# Patient Record
Sex: Female | Born: 1955 | Race: Asian | Hispanic: No | Marital: Married | Smoking: Current some day smoker
Health system: Southern US, Community
[De-identification: ages and names within clinical notes are randomized; demographics above are authoritative.]

## PROBLEM LIST (undated history)

## (undated) DIAGNOSIS — D649 Anemia, unspecified: Secondary | ICD-10-CM

## (undated) DIAGNOSIS — IMO0001 Reserved for inherently not codable concepts without codable children: Secondary | ICD-10-CM

## (undated) DIAGNOSIS — E079 Disorder of thyroid, unspecified: Secondary | ICD-10-CM

## (undated) DIAGNOSIS — M1990 Unspecified osteoarthritis, unspecified site: Secondary | ICD-10-CM

## (undated) DIAGNOSIS — I10 Essential (primary) hypertension: Secondary | ICD-10-CM

## (undated) DIAGNOSIS — R3915 Urgency of urination: Secondary | ICD-10-CM

## (undated) HISTORY — PX: FRACTURE SURGERY: SHX138

## (undated) HISTORY — PX: EXTERNAL EAR SURGERY: SHX627

## (undated) HISTORY — PX: ABDOMINAL HYSTERECTOMY: SHX81

## (undated) HISTORY — PX: COLONOSCOPY: SHX174

---

## 2007-04-23 ENCOUNTER — Encounter: Admission: RE | Admit: 2007-04-23 | Discharge: 2007-04-23 | Payer: Self-pay | Admitting: Otolaryngology

## 2008-07-26 ENCOUNTER — Other Ambulatory Visit: Admission: RE | Admit: 2008-07-26 | Discharge: 2008-07-26 | Payer: Self-pay | Admitting: Family Medicine

## 2010-07-08 ENCOUNTER — Encounter: Payer: Self-pay | Admitting: Otolaryngology

## 2011-08-28 ENCOUNTER — Other Ambulatory Visit: Payer: Self-pay | Admitting: Family Medicine

## 2011-08-28 DIAGNOSIS — Z1231 Encounter for screening mammogram for malignant neoplasm of breast: Secondary | ICD-10-CM

## 2011-09-30 ENCOUNTER — Ambulatory Visit
Admission: RE | Admit: 2011-09-30 | Discharge: 2011-09-30 | Disposition: A | Discharge status: Home / Self Care | Payer: Federal, State, Local not specified - PPO | Source: Ambulatory Visit | Attending: Family Medicine | Admitting: Family Medicine

## 2011-09-30 DIAGNOSIS — Z1231 Encounter for screening mammogram for malignant neoplasm of breast: Secondary | ICD-10-CM

## 2011-10-02 ENCOUNTER — Other Ambulatory Visit: Payer: Self-pay | Admitting: Otolaryngology

## 2011-10-02 DIAGNOSIS — H7190 Unspecified cholesteatoma, unspecified ear: Secondary | ICD-10-CM

## 2011-10-02 DIAGNOSIS — H903 Sensorineural hearing loss, bilateral: Secondary | ICD-10-CM

## 2011-10-08 ENCOUNTER — Ambulatory Visit
Admission: RE | Admit: 2011-10-08 | Discharge: 2011-10-08 | Disposition: A | Discharge status: Home / Self Care | Payer: Federal, State, Local not specified - PPO | Source: Ambulatory Visit | Attending: Otolaryngology | Admitting: Otolaryngology

## 2011-10-08 DIAGNOSIS — H903 Sensorineural hearing loss, bilateral: Secondary | ICD-10-CM

## 2011-10-08 DIAGNOSIS — H7190 Unspecified cholesteatoma, unspecified ear: Secondary | ICD-10-CM

## 2011-10-29 ENCOUNTER — Other Ambulatory Visit: Payer: Self-pay | Admitting: Otolaryngology

## 2013-12-21 ENCOUNTER — Other Ambulatory Visit: Payer: Self-pay

## 2013-12-21 DIAGNOSIS — Z1231 Encounter for screening mammogram for malignant neoplasm of breast: Secondary | ICD-10-CM

## 2013-12-24 ENCOUNTER — Ambulatory Visit
Admission: RE | Admit: 2013-12-24 | Discharge: 2013-12-24 | Disposition: A | Discharge status: Home / Self Care | Payer: Federal, State, Local not specified - PPO | Source: Ambulatory Visit

## 2013-12-24 ENCOUNTER — Encounter (INDEPENDENT_AMBULATORY_CARE_PROVIDER_SITE_OTHER): Payer: Self-pay

## 2013-12-24 DIAGNOSIS — Z1231 Encounter for screening mammogram for malignant neoplasm of breast: Secondary | ICD-10-CM

## 2014-04-08 ENCOUNTER — Ambulatory Visit
Admission: RE | Admit: 2014-04-08 | Discharge: 2014-04-08 | Disposition: A | Discharge status: Home / Self Care | Payer: Federal, State, Local not specified - PPO | Source: Ambulatory Visit | Attending: Physician Assistant | Admitting: Physician Assistant

## 2014-04-08 ENCOUNTER — Other Ambulatory Visit: Payer: Self-pay | Admitting: Physician Assistant

## 2014-04-08 DIAGNOSIS — M79671 Pain in right foot: Secondary | ICD-10-CM

## 2014-04-08 DIAGNOSIS — M79672 Pain in left foot: Principal | ICD-10-CM

## 2016-02-18 ENCOUNTER — Emergency Department (HOSPITAL_COMMUNITY)
Admission: EM | Admit: 2016-02-18 | Discharge: 2016-02-18 | Disposition: A | Discharge status: Home / Self Care | Payer: Federal, State, Local not specified - PPO | Attending: Emergency Medicine | Admitting: Emergency Medicine

## 2016-02-18 ENCOUNTER — Encounter (HOSPITAL_COMMUNITY): Payer: Self-pay | Admitting: Emergency Medicine

## 2016-02-18 ENCOUNTER — Emergency Department (HOSPITAL_COMMUNITY): Payer: Federal, State, Local not specified - PPO

## 2016-02-18 DIAGNOSIS — S52611A Displaced fracture of right ulna styloid process, initial encounter for closed fracture: Secondary | ICD-10-CM | POA: Diagnosis not present

## 2016-02-18 DIAGNOSIS — S52501A Unspecified fracture of the lower end of right radius, initial encounter for closed fracture: Secondary | ICD-10-CM | POA: Insufficient documentation

## 2016-02-18 DIAGNOSIS — Z87891 Personal history of nicotine dependence: Secondary | ICD-10-CM | POA: Insufficient documentation

## 2016-02-18 DIAGNOSIS — Y939 Activity, unspecified: Secondary | ICD-10-CM | POA: Insufficient documentation

## 2016-02-18 DIAGNOSIS — W010XXA Fall on same level from slipping, tripping and stumbling without subsequent striking against object, initial encounter: Secondary | ICD-10-CM | POA: Insufficient documentation

## 2016-02-18 DIAGNOSIS — S62101A Fracture of unspecified carpal bone, right wrist, initial encounter for closed fracture: Secondary | ICD-10-CM

## 2016-02-18 DIAGNOSIS — Y929 Unspecified place or not applicable: Secondary | ICD-10-CM | POA: Diagnosis not present

## 2016-02-18 DIAGNOSIS — Y999 Unspecified external cause status: Secondary | ICD-10-CM | POA: Insufficient documentation

## 2016-02-18 DIAGNOSIS — I10 Essential (primary) hypertension: Secondary | ICD-10-CM | POA: Insufficient documentation

## 2016-02-18 DIAGNOSIS — S6991XA Unspecified injury of right wrist, hand and finger(s), initial encounter: Secondary | ICD-10-CM | POA: Diagnosis present

## 2016-02-18 HISTORY — DX: Disorder of thyroid, unspecified: E07.9

## 2016-02-18 HISTORY — DX: Essential (primary) hypertension: I10

## 2016-02-18 MED ORDER — OXYCODONE-ACETAMINOPHEN 5-325 MG PO TABS
1.0000 | ORAL_TABLET | ORAL | Status: DC | PRN
  Administered 2016-02-18: 1 via ORAL
  Filled 2016-02-18: qty 1

## 2016-02-18 MED ORDER — OXYCODONE-ACETAMINOPHEN 5-325 MG PO TABS: 1.0000 | ORAL_TABLET | Freq: Four times a day (QID) | ORAL | 0 refills | Status: AC | PRN

## 2016-02-18 NOTE — ED Triage Notes (Signed)
Pt to ED c/o wrist pain. Pt fell this morning on her wrist. Pt states she can't move wrist anymore. Pt said it started swelling right after fall. Pt applied ice to wrist after fall. Pt can still feel and move fingers. Pt has strong pulse in wrist.

## 2016-02-18 NOTE — ED Provider Notes (Signed)
[facility]-EMERGENCY DEPT Provider Note   CSN: [id number] Arrival date & time: 02/18/16  7829     History   Chief Complaint Chief Complaint  Patient presents with  . Wrist Pain    HPI Kimberly Gates is a 60 y.o. female.  HPI Patient presents to the emergency department with right wrist pain following a fall that occurs prior to arrival.  Patient states she slipped on some hardwood floors and landed on an outstretched hand.  She is complaining of right wrist pain.  She states that movement and palpation make the pain worse.  She states she did not take any medications prior to arrival for her symptoms.  Patient states that she has not have any other injury.  Patient denies headache, blurred vision, numbness, weakness, or syncope Past Medical History:  Diagnosis Date  . Hypertension   . Thyroid disease     There are no active problems to display for this patient.   Past Surgical History:  Procedure Laterality Date  . CESAREAN SECTION     x3    OB History    No data available       Home Medications    Prior to Admission medications   Not on File    Family History No family history on file.  Social History Social History  Substance Use Topics  . Smoking status: Former Smoker    Types: Cigarettes    Quit date: 2012  . Smokeless tobacco: Never Used  . Alcohol use No     Allergies   Review of patient's allergies indicates not on file.   Review of Systems Review of Systems All other systems negative except as documented in the HPI. All pertinent positives and negatives as reviewed in the HPI.  Physical Exam Updated Vital Signs BP (!) 156/103 (BP Location: Left Arm)   Pulse 76   Temp 98.5 F (36.9 C) (Oral)   Resp 18   Ht 4\' 9"  (1.448 m)   Wt 69.4 kg   SpO2 100%   BMI 33.11 kg/m   Physical Exam  Constitutional: She is oriented to person, place, and time. She appears well-developed and well-nourished.  Eyes: Pupils are equal, round, and  reactive to light.  Pulmonary/Chest: Effort normal.  Musculoskeletal:       Right wrist: She exhibits decreased range of motion, tenderness, bony tenderness and swelling. She exhibits no crepitus, no deformity and no laceration.  Neurological: She is alert and oriented to person, place, and time.  Skin: Skin is warm and dry.  Psychiatric: She has a normal mood and affect.     ED Treatments / Results  Labs (all labs ordered are listed, but only abnormal results are displayed) Labs Reviewed - No data to display  EKG  EKG Interpretation None       Radiology Dg Wrist Complete Right  Result Date: 02/18/2016 CLINICAL DATA:  Status post fall with right wrist pain EXAM: RIGHT WRIST - COMPLETE 3+ VIEW COMPARISON:  None. FINDINGS: There is comminuted displaced impacted intra-articular fracture of distal radius. There is fracture of ulna styloid. IMPRESSION: Fracture of distal ulna and radius. Electronically Signed   By: Sherian Rein M.D.   On: 02/18/2016 09:55    Procedures Procedures (including critical care time)  Medications Ordered in ED Medications  oxyCODONE-acetaminophen (PERCOCET/ROXICET) 5-325 MG per tablet 1 tablet (1 tablet Oral Given 02/18/16 0953)     Initial Impression / Assessment and Plan / ED Course  I have reviewed the triage  vital signs and the nursing notes.  Pertinent labs & imaging results that were available during my care of the patient were reviewed by me and considered in my medical decision making (see chart for details).  Clinical Course    Patient has a distal radius fracture with ulnar styloid fracture.  All splint the patient and have her follow-up with hand surgery.  Patient is advised plan and all questions were answered  Final Clinical Impressions(s) / ED Diagnoses   Final diagnoses:  None    New Prescriptions New Prescriptions   No medications on file     Charlestine NightChristopher Raymund Manrique, PA-C 02/19/16 1539    Lorre NickAnthony Allen, MD 02/20/16 [phone]

## 2016-02-18 NOTE — Discharge Instructions (Signed)
Follow-up with the hand surgeon provided.  Return here as needed.  Ice and elevate the wrist. °

## 2016-02-20 ENCOUNTER — Encounter (HOSPITAL_COMMUNITY): Payer: Self-pay | Admitting: *Deleted

## 2016-02-20 NOTE — H&P (Signed)
Kimberly Gates is an 60 y.o. female.   Chief Complaint: RIGHT WRIST INJURY HPI: PT SUSTAINED FALL AND LANDED ON RIGHT WRIST PT SEEN/EVALAUTED IN OFFICE PT HERE FOR SURGERY ON RIGHT WRIST FOR COMMINUTED RIGHT DISTAL RADIUS FRACTURE NO PRIOR SURGERY TO RIGHT WRIST  Past Medical History:  Diagnosis Date  . Anemia    "long time ago"  . Arthritis   . Hypertension   . Shortness of breath dyspnea    on long walks  . Thyroid disease   . Urinary urgency     Past Surgical History:  Procedure Laterality Date  . ABDOMINAL HYSTERECTOMY    . CESAREAN SECTION     x3  . COLONOSCOPY    . EXTERNAL EAR SURGERY      Family History  Problem Relation Age of Onset  . Heart disease Mother   . Hypertension Mother   . Heart disease Father   . Hypertension Father    Social History:  reports that she quit smoking about 5 years ago. Her smoking use included Cigarettes. She has never used smokeless tobacco. She reports that she drinks alcohol. She reports that she does not use drugs.  Allergies: Not on File  No prescriptions prior to admission.    No results found for this or any previous visit (from the past 48 hour(s)). No results found.  ROSNO RECENT ILLNESSES OR HOSPITALIZATIONS  There were no vitals taken for this visit. Physical Exam   General Appearance:  Alert, cooperative, no distress, appears stated age  Head:  Normocephalic, without obvious abnormality, atraumatic  Eyes:  Pupils equal, conjunctiva/corneas clear,         Throat: Lips, mucosa, and tongue normal; teeth and gums normal  Neck: No visible masses     Lungs:   respirations unlabored  Chest Wall:  No tenderness or deformity  Heart:  Regular rate and rhythm,  Abdomen:   Soft, non-tender,         Extremities: RUE: SKIN INTACT, FINGERS WARM WELL PERFUSED ABLE TO EXTEND THUMB AND FINGERS GOOD DIGITAL FLEXION  Pulses: 2+ and symmetric  Skin: Skin color, texture, turgor normal, no rashes or lesions      Neurologic: Normal    Assessment/Plan RIGHT COMMINUTED DISTAL RADIUS FRACTURE, DISPLACED  RIGHT WRIST OPEN REDUCTION AND INTERNAL FIXATION AND REPAIR AS INDICATED  R/B/A DISCUSSED WITH PT IN OFFICE.  PT VOICED UNDERSTANDING OF PLAN CONSENT SIGNED DAY OF SURGERY PT SEEN AND EXAMINED PRIOR TO OPERATIVE PROCEDURE/DAY OF SURGERY SITE MARKED. QUESTIONS ANSWERED WILL BE ADMITTED OBSERVATION FOLLOWING SURGERY  WE ARE PLANNING SURGERY FOR YOUR UPPER EXTREMITY. THE RISKS AND BENEFITS OF SURGERY INCLUDE BUT NOT LIMITED TO BLEEDING INFECTION, DAMAGE TO NEARBY NERVES ARTERIES TENDONS, FAILURE OF SURGERY TO ACCOMPLISH ITS INTENDED GOALS, PERSISTENT SYMPTOMS AND NEED FOR FURTHER SURGICAL INTERVENTION. WITH THIS IN MIND WE WILL PROCEED. I HAVE DISCUSSED WITH THE PATIENT THE PRE AND POSTOPERATIVE REGIMEN AND THE DOS AND DON'TS. PT VOICED UNDERSTANDING AND INFORMED CONSENT SIGNED. Sharma CovertORTMANN,Kimberly Gates W  02/21/2016 AT 1840

## 2016-02-20 NOTE — Progress Notes (Signed)
Spoke with pt for pre-op call. Pt denies cardiac history and chest pain. States she does get sob with exertion (hiking).

## 2016-02-21 ENCOUNTER — Encounter (HOSPITAL_COMMUNITY)
Admission: RE | Disposition: A | Discharge status: Home / Self Care | Payer: Self-pay | Source: Ambulatory Visit | Attending: Orthopedic Surgery

## 2016-02-21 ENCOUNTER — Ambulatory Visit (HOSPITAL_COMMUNITY): Payer: Federal, State, Local not specified - PPO | Admitting: Anesthesiology

## 2016-02-21 ENCOUNTER — Encounter (HOSPITAL_COMMUNITY): Payer: Self-pay | Admitting: *Deleted

## 2016-02-21 ENCOUNTER — Ambulatory Visit (HOSPITAL_COMMUNITY)
Admission: RE | Admit: 2016-02-21 | Discharge: 2016-02-22 | Disposition: A | Discharge status: Home / Self Care | Payer: Federal, State, Local not specified - PPO | Source: Ambulatory Visit | Attending: Orthopedic Surgery | Admitting: Orthopedic Surgery

## 2016-02-21 DIAGNOSIS — S52571A Other intraarticular fracture of lower end of right radius, initial encounter for closed fracture: Secondary | ICD-10-CM | POA: Diagnosis present

## 2016-02-21 DIAGNOSIS — I10 Essential (primary) hypertension: Secondary | ICD-10-CM | POA: Diagnosis not present

## 2016-02-21 DIAGNOSIS — E039 Hypothyroidism, unspecified: Secondary | ICD-10-CM | POA: Diagnosis not present

## 2016-02-21 DIAGNOSIS — Z87891 Personal history of nicotine dependence: Secondary | ICD-10-CM | POA: Diagnosis not present

## 2016-02-21 DIAGNOSIS — Z79899 Other long term (current) drug therapy: Secondary | ICD-10-CM | POA: Diagnosis not present

## 2016-02-21 DIAGNOSIS — W19XXXA Unspecified fall, initial encounter: Secondary | ICD-10-CM | POA: Diagnosis not present

## 2016-02-21 DIAGNOSIS — S52611A Displaced fracture of right ulna styloid process, initial encounter for closed fracture: Secondary | ICD-10-CM | POA: Diagnosis not present

## 2016-02-21 DIAGNOSIS — S52531A Colles' fracture of right radius, initial encounter for closed fracture: Secondary | ICD-10-CM | POA: Diagnosis present

## 2016-02-21 HISTORY — DX: Reserved for inherently not codable concepts without codable children: IMO0001

## 2016-02-21 HISTORY — DX: Unspecified osteoarthritis, unspecified site: M19.90

## 2016-02-21 HISTORY — DX: Anemia, unspecified: D64.9

## 2016-02-21 HISTORY — PX: OPEN REDUCTION INTERNAL FIXATION (ORIF) DISTAL RADIAL FRACTURE: SHX5989

## 2016-02-21 HISTORY — DX: Urgency of urination: R39.15

## 2016-02-21 LAB — BASIC METABOLIC PANEL
ANION GAP: 12 (ref 5–15)
BUN: 17 mg/dL (ref 6–20)
CO2: 22 mmol/L (ref 22–32)
Calcium: 10 mg/dL (ref 8.9–10.3)
Chloride: 105 mmol/L (ref 101–111)
Creatinine, Ser: 0.7 mg/dL (ref 0.44–1.00)
GFR calc Af Amer: >60 mL/min (ref >60)
GFR calc non Af Amer: >60 mL/min (ref >60)
GLUCOSE: 90 mg/dL (ref 65–99)
POTASSIUM: 4 mmol/L (ref 3.5–5.1)
Sodium: 139 mmol/L (ref 135–145)

## 2016-02-21 LAB — CBC
HEMATOCRIT: 41.6 % (ref 36.0–46.0)
HEMOGLOBIN: 13.5 g/dL (ref 12.0–15.0)
MCH: 28.8 pg (ref 26.0–34.0)
MCHC: 32.5 g/dL (ref 30.0–36.0)
MCV: 88.9 fL (ref 78.0–100.0)
Platelets: 266 10*3/uL (ref 150–400)
RBC: 4.68 MIL/uL (ref 3.87–5.11)
RDW: 13.2 % (ref 11.5–15.5)
WBC: 9.4 10*3/uL (ref 4.0–10.5)

## 2016-02-21 LAB — SURGICAL PCR SCREEN
MRSA, PCR: NEGATIVE
Staphylococcus aureus: NEGATIVE

## 2016-02-21 SURGERY — OPEN REDUCTION INTERNAL FIXATION (ORIF) DISTAL RADIUS FRACTURE
Anesthesia: General | Site: Wrist | Laterality: Right

## 2016-02-21 MED ORDER — ADULT MULTIVITAMIN W/MINERALS CH
ORAL_TABLET | Freq: Every day | ORAL | Status: DC
  Filled 2016-02-21: qty 1

## 2016-02-21 MED ORDER — VITAMIN C 500 MG PO TABS
1000.0000 mg | ORAL_TABLET | Freq: Every day | ORAL | Status: DC
  Administered 2016-02-21 – 2016-02-22 (×2): 1000 mg via ORAL
  Filled 2016-02-21 (×2): qty 2

## 2016-02-21 MED ORDER — DIPHENHYDRAMINE HCL 25 MG PO CAPS: 25.0000 mg | ORAL_CAPSULE | Freq: Four times a day (QID) | ORAL | Status: DC | PRN

## 2016-02-21 MED ORDER — LACTATED RINGERS IV SOLN
INTRAVENOUS | Status: DC | PRN
  Administered 2016-02-21: 19:00:00 via INTRAVENOUS

## 2016-02-21 MED ORDER — KCL IN DEXTROSE-NACL 20-5-0.45 MEQ/L-%-% IV SOLN
INTRAVENOUS | Status: DC
  Administered 2016-02-21: 21:00:00 via INTRAVENOUS
  Filled 2016-02-21: qty 1000

## 2016-02-21 MED ORDER — LEVOTHYROXINE SODIUM 75 MCG PO TABS
75.0000 ug | ORAL_TABLET | Freq: Every day | ORAL | Status: DC
  Administered 2016-02-22: 75 ug via ORAL
  Filled 2016-02-21: qty 1

## 2016-02-21 MED ORDER — CEFAZOLIN SODIUM-DEXTROSE 2-4 GM/100ML-% IV SOLN
INTRAVENOUS | Status: AC
  Filled 2016-02-21: qty 100

## 2016-02-21 MED ORDER — ONDANSETRON HCL 4 MG PO TABS: 4.0000 mg | ORAL_TABLET | Freq: Four times a day (QID) | ORAL | Status: DC | PRN

## 2016-02-21 MED ORDER — HYDROMORPHONE HCL 1 MG/ML IJ SOLN: 0.5000 mg | INTRAMUSCULAR | Status: DC | PRN

## 2016-02-21 MED ORDER — FENTANYL CITRATE (PF) 100 MCG/2ML IJ SOLN
INTRAMUSCULAR | Status: AC
  Filled 2016-02-21: qty 2

## 2016-02-21 MED ORDER — 0.9 % SODIUM CHLORIDE (POUR BTL) OPTIME
TOPICAL | Status: DC | PRN
  Administered 2016-02-21: 1000 mL

## 2016-02-21 MED ORDER — HYDROCODONE-ACETAMINOPHEN 7.5-325 MG PO TABS
1.0000 | ORAL_TABLET | ORAL | Status: DC | PRN
  Administered 2016-02-22: 1 via ORAL
  Filled 2016-02-21: qty 1

## 2016-02-21 MED ORDER — ADULT MULTIVITAMIN W/MINERALS CH
1.0000 | ORAL_TABLET | Freq: Every day | ORAL | Status: DC
  Administered 2016-02-21 – 2016-02-22 (×2): 1 via ORAL
  Filled 2016-02-21 (×2): qty 1

## 2016-02-21 MED ORDER — PROPOFOL 10 MG/ML IV BOLUS
INTRAVENOUS | Status: DC | PRN
  Administered 2016-02-21: 50 mg via INTRAVENOUS
  Administered 2016-02-21: 150 mg via INTRAVENOUS

## 2016-02-21 MED ORDER — BUPIVACAINE-EPINEPHRINE (PF) 0.5% -1:200000 IJ SOLN
INTRAMUSCULAR | Status: DC | PRN
  Administered 2016-02-21: 30 mL via PERINEURAL

## 2016-02-21 MED ORDER — SUCCINYLCHOLINE CHLORIDE 20 MG/ML IJ SOLN
INTRAMUSCULAR | Status: DC | PRN
  Administered 2016-02-21: 20 mg via INTRAVENOUS

## 2016-02-21 MED ORDER — MIDAZOLAM HCL 2 MG/2ML IJ SOLN
INTRAMUSCULAR | Status: AC
  Administered 2016-02-21: 2 mg via INTRAVENOUS
  Filled 2016-02-21: qty 2

## 2016-02-21 MED ORDER — LACTATED RINGERS IV SOLN: INTRAVENOUS | Status: DC

## 2016-02-21 MED ORDER — CHLORHEXIDINE GLUCONATE 4 % EX LIQD: 60.0000 mL | Freq: Once | CUTANEOUS | Status: DC

## 2016-02-21 MED ORDER — MIDAZOLAM HCL 2 MG/2ML IJ SOLN
INTRAMUSCULAR | Status: AC
  Filled 2016-02-21: qty 2

## 2016-02-21 MED ORDER — CEFAZOLIN IN D5W 1 GM/50ML IV SOLN
1.0000 g | INTRAVENOUS | Status: AC
  Administered 2016-02-21: 1 g via INTRAVENOUS
  Filled 2016-02-21: qty 50

## 2016-02-21 MED ORDER — MEPERIDINE HCL 25 MG/ML IJ SOLN: 6.2500 mg | INTRAMUSCULAR | Status: DC | PRN

## 2016-02-21 MED ORDER — DOCUSATE SODIUM 100 MG PO CAPS: 100.0000 mg | ORAL_CAPSULE | Freq: Two times a day (BID) | ORAL | 0 refills | Status: AC

## 2016-02-21 MED ORDER — PROPOFOL 10 MG/ML IV BOLUS
INTRAVENOUS | Status: AC
  Filled 2016-02-21: qty 20

## 2016-02-21 MED ORDER — MUPIROCIN 2 % EX OINT
TOPICAL_OINTMENT | CUTANEOUS | Status: AC
  Administered 2016-02-21: 1
  Filled 2016-02-21: qty 22

## 2016-02-21 MED ORDER — FENTANYL CITRATE (PF) 100 MCG/2ML IJ SOLN
INTRAMUSCULAR | Status: DC | PRN
  Administered 2016-02-21 (×2): 50 ug via INTRAVENOUS

## 2016-02-21 MED ORDER — ZOLPIDEM TARTRATE 5 MG PO TABS: 5.0000 mg | ORAL_TABLET | Freq: Every evening | ORAL | Status: DC | PRN

## 2016-02-21 MED ORDER — CEFAZOLIN IN D5W 1 GM/50ML IV SOLN
1.0000 g | Freq: Three times a day (TID) | INTRAVENOUS | Status: DC
  Administered 2016-02-22: 1 g via INTRAVENOUS
  Filled 2016-02-21 (×3): qty 50

## 2016-02-21 MED ORDER — METHOCARBAMOL 1000 MG/10ML IJ SOLN
500.0000 mg | Freq: Four times a day (QID) | INTRAVENOUS | Status: DC | PRN
  Filled 2016-02-21: qty 5

## 2016-02-21 MED ORDER — BENAZEPRIL HCL 10 MG PO TABS
10.0000 mg | ORAL_TABLET | Freq: Every day | ORAL | Status: DC
  Administered 2016-02-22: 10 mg via ORAL
  Filled 2016-02-21 (×2): qty 1

## 2016-02-21 MED ORDER — MIDAZOLAM HCL 5 MG/5ML IJ SOLN
INTRAMUSCULAR | Status: DC | PRN
  Administered 2016-02-21: 2 mg via INTRAVENOUS

## 2016-02-21 MED ORDER — VITAMIN C 500 MG PO TABS: 500.0000 mg | ORAL_TABLET | Freq: Every day | ORAL | 0 refills | Status: AC

## 2016-02-21 MED ORDER — MIDAZOLAM HCL 2 MG/2ML IJ SOLN
2.0000 mg | Freq: Once | INTRAMUSCULAR | Status: AC
  Administered 2016-02-21: 2 mg via INTRAVENOUS

## 2016-02-21 MED ORDER — METHOCARBAMOL 500 MG PO TABS: 500.0000 mg | ORAL_TABLET | Freq: Four times a day (QID) | ORAL | 0 refills | Status: AC

## 2016-02-21 MED ORDER — PROMETHAZINE HCL 25 MG/ML IJ SOLN
6.2500 mg | INTRAMUSCULAR | Status: DC | PRN
Start: 2016-02-21 — End: 2016-02-21

## 2016-02-21 MED ORDER — OXYCODONE-ACETAMINOPHEN 5-325 MG PO TABS: 1.0000 | ORAL_TABLET | ORAL | Status: DC | PRN

## 2016-02-21 MED ORDER — OXYCODONE-ACETAMINOPHEN 5-325 MG PO TABS: 1.0000 | ORAL_TABLET | Freq: Three times a day (TID) | ORAL | 0 refills | Status: AC

## 2016-02-21 MED ORDER — METHOCARBAMOL 500 MG PO TABS: 500.0000 mg | ORAL_TABLET | Freq: Four times a day (QID) | ORAL | Status: DC | PRN

## 2016-02-21 MED ORDER — LIDOCAINE 2% (20 MG/ML) 5 ML SYRINGE
INTRAMUSCULAR | Status: AC
  Filled 2016-02-21: qty 5

## 2016-02-21 MED ORDER — LACTATED RINGERS IV SOLN
INTRAVENOUS | Status: DC
  Administered 2016-02-21: 17:00:00 via INTRAVENOUS

## 2016-02-21 MED ORDER — OXYCODONE-ACETAMINOPHEN 5-325 MG PO TABS: 1.0000 | ORAL_TABLET | Freq: Four times a day (QID) | ORAL | Status: DC | PRN

## 2016-02-21 MED ORDER — LIDOCAINE HCL (CARDIAC) 20 MG/ML IV SOLN
INTRAVENOUS | Status: DC | PRN
  Administered 2016-02-21: 60 mg via INTRAVENOUS

## 2016-02-21 MED ORDER — ONDANSETRON HCL 4 MG/2ML IJ SOLN
4.0000 mg | Freq: Four times a day (QID) | INTRAMUSCULAR | Status: DC | PRN
Start: 2016-02-21 — End: 2016-02-22

## 2016-02-21 MED ORDER — HYDROMORPHONE HCL 1 MG/ML IJ SOLN: 0.2500 mg | INTRAMUSCULAR | Status: DC | PRN

## 2016-02-21 MED ORDER — ONDANSETRON HCL 4 MG/2ML IJ SOLN
INTRAMUSCULAR | Status: DC | PRN
  Administered 2016-02-21: 4 mg via INTRAVENOUS

## 2016-02-21 MED ORDER — CEFAZOLIN SODIUM-DEXTROSE 2-4 GM/100ML-% IV SOLN
2.0000 g | INTRAVENOUS | Status: AC
  Administered 2016-02-21: 2 g via INTRAVENOUS

## 2016-02-21 MED ORDER — FENTANYL CITRATE (PF) 100 MCG/2ML IJ SOLN
INTRAMUSCULAR | Status: AC
  Administered 2016-02-21: 50 ug via INTRAVENOUS
  Filled 2016-02-21: qty 2

## 2016-02-21 MED ORDER — FENTANYL CITRATE (PF) 100 MCG/2ML IJ SOLN
50.0000 ug | Freq: Once | INTRAMUSCULAR | Status: AC
  Administered 2016-02-21: 50 ug via INTRAVENOUS

## 2016-02-21 SURGICAL SUPPLY — 54 items
BANDAGE ELASTIC 3 VELCRO ST LF (GAUZE/BANDAGES/DRESSINGS) ×2 IMPLANT
BANDAGE ELASTIC 4 VELCRO ST LF (GAUZE/BANDAGES/DRESSINGS) ×2 IMPLANT
BIT DRILL 2.2 SS TIBIAL (BIT) ×2 IMPLANT
BLADE SURG ROTATE 9660 (MISCELLANEOUS) IMPLANT
BNDG ESMARK 4X9 LF (GAUZE/BANDAGES/DRESSINGS) ×2 IMPLANT
BNDG GAUZE ELAST 4 BULKY (GAUZE/BANDAGES/DRESSINGS) ×2 IMPLANT
CANISTER SUCTION 2500CC (MISCELLANEOUS) ×2 IMPLANT
CORDS BIPOLAR (ELECTRODE) ×2 IMPLANT
COVER SURGICAL LIGHT HANDLE (MISCELLANEOUS) ×2 IMPLANT
CUFF TOURNIQUET SINGLE 18IN (TOURNIQUET CUFF) ×2 IMPLANT
CUFF TOURNIQUET SINGLE 24IN (TOURNIQUET CUFF) IMPLANT
DECANTER SPIKE VIAL GLASS SM (MISCELLANEOUS) ×2 IMPLANT
DRAPE OEC MINIVIEW 54X84 (DRAPES) ×2 IMPLANT
DRAPE SURG 17X11 SM STRL (DRAPES) ×2 IMPLANT
DRSG ADAPTIC 3X8 NADH LF (GAUZE/BANDAGES/DRESSINGS) ×2 IMPLANT
GAUZE SPONGE 4X4 12PLY STRL (GAUZE/BANDAGES/DRESSINGS) ×2 IMPLANT
GAUZE SPONGE 4X4 16PLY XRAY LF (GAUZE/BANDAGES/DRESSINGS) IMPLANT
GLOVE BIOGEL PI IND STRL 8.5 (GLOVE) ×1 IMPLANT
GLOVE BIOGEL PI INDICATOR 8.5 (GLOVE) ×1
GLOVE SURG ORTHO 8.0 STRL STRW (GLOVE) ×2 IMPLANT
GOWN STRL REUS W/ TWL LRG LVL3 (GOWN DISPOSABLE) ×1 IMPLANT
GOWN STRL REUS W/ TWL XL LVL3 (GOWN DISPOSABLE) ×1 IMPLANT
GOWN STRL REUS W/TWL LRG LVL3 (GOWN DISPOSABLE) ×1
GOWN STRL REUS W/TWL XL LVL3 (GOWN DISPOSABLE) ×1
K-WIRE 1.6 (WIRE) ×1
K-WIRE FX5X1.6XNS BN SS (WIRE) ×1
KIT BASIN OR (CUSTOM PROCEDURE TRAY) ×2 IMPLANT
KIT ROOM TURNOVER OR (KITS) ×2 IMPLANT
KWIRE FX5X1.6XNS BN SS (WIRE) ×1 IMPLANT
NEEDLE HYPO 25X1 1.5 SAFETY (NEEDLE) ×2 IMPLANT
NS IRRIG 1000ML POUR BTL (IV SOLUTION) ×2 IMPLANT
PACK ORTHO EXTREMITY (CUSTOM PROCEDURE TRAY) ×2 IMPLANT
PAD ARMBOARD 7.5X6 YLW CONV (MISCELLANEOUS) ×4 IMPLANT
PAD CAST 4YDX4 CTTN HI CHSV (CAST SUPPLIES) ×1 IMPLANT
PADDING CAST COTTON 4X4 STRL (CAST SUPPLIES) ×1
PEG LOCKING SMOOTH 2.2X16 (Screw) ×2 IMPLANT
PEG LOCKING SMOOTH 2.2X18 (Peg) ×4 IMPLANT
PEG LOCKING SMOOTH 2.2X20 (Screw) ×6 IMPLANT
PLATE DVR CROSSLOCK STD RT (Plate) ×2 IMPLANT
SCREW LOCK 12X2.7X 3 LD (Screw) ×2 IMPLANT
SCREW LOCK 14X2.7X 3 LD TPR (Screw) ×3 IMPLANT
SCREW LOCKING 2.7X12MM (Screw) ×2 IMPLANT
SCREW LOCKING 2.7X14 (Screw) ×3 IMPLANT
SCREW MULTI DIRECTIONAL 2.7X16 (Screw) ×2 IMPLANT
SOAP 2 % CHG 4 OZ (WOUND CARE) ×2 IMPLANT
SPONGE LAP 4X18 X RAY DECT (DISPOSABLE) ×2 IMPLANT
SUT PROLENE 4 0 PS 2 18 (SUTURE) ×4 IMPLANT
SUT VIC AB 2-0 FS1 27 (SUTURE) ×2 IMPLANT
SUT VICRYL 4-0 PS2 18IN ABS (SUTURE) ×2 IMPLANT
SYR CONTROL 10ML LL (SYRINGE) IMPLANT
TOWEL OR 17X24 6PK STRL BLUE (TOWEL DISPOSABLE) ×2 IMPLANT
TOWEL OR 17X26 10 PK STRL BLUE (TOWEL DISPOSABLE) ×2 IMPLANT
TUBE CONNECTING 12X1/4 (SUCTIONS) ×2 IMPLANT
YANKAUER SUCT BULB TIP NO VENT (SUCTIONS) ×2 IMPLANT

## 2016-02-21 NOTE — Anesthesia Procedure Notes (Signed)
Procedure Name: LMA Insertion Date/Time: 02/21/2016 6:47 PM Performed by: Gavin PoundLOWDER, Eugina Row J Pre-anesthesia Checklist: Patient identified, Emergency Drugs available, Suction available, Patient being monitored and Timeout performed Patient Re-evaluated:Patient Re-evaluated prior to inductionOxygen Delivery Method: Circle system utilized Preoxygenation: Pre-oxygenation with 100% oxygen Intubation Type: IV induction Ventilation: Mask ventilation without difficulty LMA: LMA inserted LMA Size: 4.0 Number of attempts: 1 Placement Confirmation: positive ETCO2 and breath sounds checked- equal and bilateral Tube secured with: Tape Dental Injury: Teeth and Oropharynx as per pre-operative assessment

## 2016-02-21 NOTE — Discharge Instructions (Signed)
KEEP BANDAGE CLEAN AND DRY °CALL OFFICE FOR F/U APPT [phone] in 14 days °DR Shazia Mitchener CELL [phone] °KEEP HAND ELEVATED ABOVE HEART °OK TO APPLY ICE TO OPERATIVE AREA °CONTACT OFFICE IF ANY WORSENING PAIN OR CONCERNS. °

## 2016-02-21 NOTE — Transfer of Care (Signed)
Immediate Anesthesia Transfer of Care Note  Patient: [name]  Procedure(s) Performed: Procedure(s): RIGHT OPEN REDUCTION INTERNAL FIXATION (ORIF) DISTAL RADIAS AND REPAIR AS INDICATED (Right)  Patient Location: PACU  Anesthesia Type:General  Level of Consciousness: awake  Airway & Oxygen Therapy: Patient Spontanous Breathing and Patient connected to nasal cannula oxygen  Post-op Assessment: Report given to RN and Post -op Vital signs reviewed and stable  Post vital signs: Reviewed and stable  Last Vitals:  Vitals:   02/21/16 1632 02/21/16 2000  BP: (!) 150/90 105/66  Pulse: 80 86  Resp: 18 15  Temp: 36.4 C 36.5 C    Last Pain:  Vitals:   02/21/16 2000  TempSrc:   PainSc: Asleep         Complications: No apparent anesthesia complications

## 2016-02-21 NOTE — Anesthesia Preprocedure Evaluation (Signed)
Anesthesia Evaluation  Patient identified by MRN, date of birth, ID band Patient awake    Reviewed: Allergy & Precautions, NPO status , Patient's Chart, lab work & pertinent test results  Airway        Dental   Pulmonary former smoker,           Cardiovascular hypertension, Pt. on medications      Neuro/Psych negative neurological ROS  negative psych ROS   GI/Hepatic negative GI ROS, Neg liver ROS,   Endo/Other  Hypothyroidism   Renal/GU negative Renal ROS  negative genitourinary   Musculoskeletal  (+) Arthritis ,   Abdominal   Peds negative pediatric ROS (+)  Hematology   Anesthesia Other Findings   Reproductive/Obstetrics negative OB ROS                             Lab Results  Component Value Date   WBC 9.4 02/21/2016   HGB 13.5 02/21/2016   HCT 41.6 02/21/2016   MCV 88.9 02/21/2016   PLT 266 02/21/2016   No results found for: CREATININE, BUN, NA, K, CL, CO2 No results found for: INR, PROTIME   02/2016 EKG: normal sinus rhythm.  Anesthesia Physical Anesthesia Plan  ASA: II  Anesthesia Plan: General   Post-op Pain Management: GA combined w/ Regional for post-op pain   Induction: Intravenous  Airway Management Planned: LMA  Additional Equipment:   Intra-op Plan:   Post-operative Plan: Extubation in OR  Informed Consent: I have reviewed the patients History and Physical, chart, labs and discussed the procedure including the risks, benefits and alternatives for the proposed anesthesia with the patient or authorized representative who has indicated his/her understanding and acceptance.     Plan Discussed with: CRNA  Anesthesia Plan Comments:         Anesthesia Quick Evaluation

## 2016-02-21 NOTE — Anesthesia Procedure Notes (Signed)
Anesthesia Regional Block:  Supraclavicular block  Pre-Anesthetic Checklist: ,, timeout performed, Correct Patient, Correct Site, Correct Laterality, Correct Procedure, Correct Position, site marked, Risks and benefits discussed,  Surgical consent,  Pre-op evaluation,  At surgeon's request and post-op pain management  Laterality: Right  Prep: chloraprep       Needles:  Injection technique: Single-shot  Needle Type: Echogenic Stimulator Needle     Needle Length: 5cm 5 cm Needle Gauge: 22 and 22 G    Additional Needles:  Procedures: ultrasound guided (picture in chart) and nerve stimulator Supraclavicular block  Nerve Stimulator or Paresthesia:  Response: biceps flexion, 0.45 mA,   Additional Responses:   Narrative:  Start time: 02/21/2016 6:11 PM End time: 02/21/2016 6:22 PM Injection made incrementally with aspirations every 5 mL.  Performed by: Personally  Anesthesiologist: Fraser Busche  Additional Notes: Functioning IV was confirmed and monitors were applied.  A 50mm 22ga Arrow echogenic stimulator needle was used. Sterile prep and drape,hand hygiene and sterile gloves were used.  Negative aspiration and negative test dose prior to incremental administration of local anesthetic. The patient tolerated the procedure well.  Ultrasound guidance: relevent anatomy identified, needle position confirmed, local anesthetic spread visualized around nerve(s), vascular puncture avoided.  Image printed for medical record.

## 2016-02-21 NOTE — Discharge Summary (Signed)
Physician Discharge Summary  Patient ID: Kimberly Gates MRN: [id number] DOB/AGE: Jul 31, 1955 60 y.o.  Admit date: 02/21/2016 Discharge date: 02/22/2016  Admission Diagnoses: RIGHT DISPLACED DISTOL RADIUS FRACTURE Past Medical History:  Diagnosis Date  . Anemia    "long time ago"  . Arthritis   . Hypertension   . Shortness of breath dyspnea    on long walks  . Thyroid disease   . Urinary urgency     Discharge Diagnoses:  Active Problems:   Closed Colles' fracture of right radius   Surgeries: Procedure(s): RIGHT OPEN REDUCTION INTERNAL FIXATION (ORIF) DISTAL RADIAS AND REPAIR AS INDICATED on 02/21/2016    Consultants: none  Discharged Condition: Improved  Hospital Course: Kimberly Gates is an 60 y.o. female who was admitted 02/21/2016 with a chief complaint of No chief complaint on file. , and found to have a diagnosis of RIGHT DISPLACED DISTOL RADIUS FRACTURE.  They were brought to the operating room on 02/21/2016 and underwent Procedure(s): RIGHT OPEN REDUCTION INTERNAL FIXATION (ORIF) DISTAL RADIAS AND REPAIR AS INDICATED.    They were given perioperative antibiotics: Anti-infectives    Start     Dose/Rate Route Frequency Ordered Stop   02/22/16 0600  ceFAZolin (ANCEF) IVPB 1 g/50 mL premix     1 g 100 mL/hr over 30 Minutes Intravenous Every 8 hours 02/21/16 2112     02/21/16 2200  ceFAZolin (ANCEF) IVPB 1 g/50 mL premix     1 g 100 mL/hr over 30 Minutes Intravenous NOW 02/21/16 2112 02/22/16 2200   02/21/16 1815  ceFAZolin (ANCEF) IVPB 2g/100 mL premix     2 g 200 mL/hr over 30 Minutes Intravenous On call to O.R. 02/21/16 1623 02/21/16 1837   02/21/16 1616  ceFAZolin (ANCEF) 2-4 GM/100ML-% IVPB    Comments:  Lorenda Ishihara   : cabinet override      02/21/16 1616 02/22/16 0429    .  They were given sequential compression devices, early ambulation, and Other (comment)ambulation for DVT prophylaxis.  Recent vital signs: Patient Vitals for the past 24 hrs:  BP  Temp Temp src Pulse Resp SpO2 Height Weight  02/21/16 2053 112/90 97.8 F (36.6 C) - 86 15 98 % - -  02/21/16 2044 112/70 - - 79 15 99 % - -  02/21/16 2030 102/66 - - 80 15 98 % - -  02/21/16 2025 103/70 - - 81 15 97 % - -  02/21/16 2015 104/67 - - 82 14 97 % - -  02/21/16 2000 105/66 97.7 F (36.5 C) - 86 15 97 % - -  02/21/16 1632 (!) 150/90 97.5 F (36.4 C) Oral 80 18 100 % 4\' 9"  (1.448 m) 153 lb (69.4 kg)  .  Recent laboratory studies: No results found.  Discharge Medications:     Medication List    TAKE these medications   ALEVE 220 MG tablet Generic drug:  naproxen sodium Take 220 mg by mouth 2 (two) times daily as needed (for pain/sinus pain from allergies).   benazepril 10 MG tablet Commonly known as:  LOTENSIN Take 10 mg by mouth daily.   docusate sodium 100 MG capsule Commonly known as:  COLACE Take 1 capsule (100 mg total) by mouth 2 (two) times daily.   levothyroxine 75 MCG tablet Commonly known as:  SYNTHROID, LEVOTHROID Take 75 mcg by mouth daily before breakfast.   methocarbamol 500 MG tablet Commonly known as:  ROBAXIN Take 1 tablet (500 mg total) by mouth 4 (four) times daily.  multivitamin capsule Take 1 capsule by mouth daily.   oxyCODONE-acetaminophen 5-325 MG tablet Commonly known as:  PERCOCET/ROXICET Take 1 tablet by mouth every 6 (six) hours as needed for severe pain. What changed:  Another medication with the same name was added. Make sure you understand how and when to take each.   oxyCODONE-acetaminophen 5-325 MG tablet Commonly known as:  ROXICET Take 1 tablet by mouth 3 (three) times daily. What changed:  You were already taking a medication with the same name, and this prescription was added. Make sure you understand how and when to take each.   vitamin C 500 MG tablet Commonly known as:  ASCORBIC ACID Take 1 tablet (500 mg total) by mouth daily.       Diagnostic Studies: Dg Wrist Complete Right  Result Date:  02/18/2016 CLINICAL DATA:  Status post fall with right wrist pain EXAM: RIGHT WRIST - COMPLETE 3+ VIEW COMPARISON:  None. FINDINGS: There is comminuted displaced impacted intra-articular fracture of distal radius. There is fracture of ulna styloid. IMPRESSION: Fracture of distal ulna and radius. Electronically Signed   By: Sherian ReinWei-Chen  Lin M.D.   On: 02/18/2016 09:55    They benefited maximally from their hospital stay and there were no complications.     Disposition: 01-Home or Self Care  Follow-up Information    Sharma CovertTMANN,Maston Wight W, MD In 2 weeks.   Specialty:  Orthopedic Surgery Contact information: [address] [place] [address] [phone]            Signed: Sharma CovertORTMANN,Noa Galvao W 02/21/2016, 9:14 PM

## 2016-02-21 NOTE — Progress Notes (Signed)
Orthopedic Tech Progress Note Patient Details:  Kimberly Gates 03-20-1956 [id number]  Ortho Devices Type of Ortho Device: Arm sling Ortho Device/Splint Location: RUE Ortho Device/Splint Interventions: Ordered, Application   Jennye MoccasinHughes, Perris Conwell Craig 02/21/2016, 10:29 PM

## 2016-02-22 ENCOUNTER — Encounter (HOSPITAL_COMMUNITY): Payer: Self-pay | Admitting: Orthopedic Surgery

## 2016-02-22 DIAGNOSIS — S52531A Colles' fracture of right radius, initial encounter for closed fracture: Secondary | ICD-10-CM | POA: Diagnosis not present

## 2016-02-22 NOTE — Progress Notes (Signed)
Discharge instructions reviewed with pt and prescriptions given.  Pt verbalized understanding and questions answered.  Pt discharged in stable condition via wheelchair with husband.  Kimberly Gates   

## 2016-02-23 NOTE — Op Note (Signed)
Kimberly Gates:  Gates, Kimberly        ACCOUNT NO.:  [id number]  MEDICAL RECORD NO.:  [id number]  LOCATION:                                 FACILITY:  PHYSICIAN:  Sharma CovertFred W. Wai Minotti IV, M.D.DATE OF BIRTH:  1956-03-09  DATE OF PROCEDURE:  02/21/2016 DATE OF DISCHARGE:                              OPERATIVE REPORT   PREOPERATIVE DIAGNOSIS:  Right wrist intra-articular distal radius fracture, 3 or more fragments.  POSTOPERATIVE DIAGNOSIS:  Right wrist intra-articular distal radius fracture, 3 or more fragments.  ATTENDING SURGEON:  Sharma CovertFred W. Rahima Fleishman, M.D. who scrubbed and present for the entire procedure.  ASSISTANT SURGEON:  None.  ANESTHESIA:  General via LMA with supraclavicular block.  SURGICAL PROCEDURE: 1. Open reduction and internal fixation of displaced intra-articular     distal radius fracture, 3 or more fragments. 2. Right wrist brachioradialis tendon release and lengthening. 3. Radiographs 3 views, right wrist.  RADIOGRAPHIC INTERPRETATION:  AP, lateral, and oblique views of the wrist did show the volar plate fixation in place.  There was good position in all planes.  SURGICAL IMPLANTS:  DVR CrossLock.  SURGICAL INDICATIONS:  Kimberly Gates is a right hand female who sustained a comminuted intra-articular distal radius fracture.  The patient was seen and evaluated and recommended to undergo the above procedure.  Risks, benefits, and alternatives were discussed in detail with the patient and signed informed consent was obtained.  Risks include, but not limited to bleeding, infection, damage to nearby nerves, arteries, or tendons; loss of motion of the wrist and digits, incomplete relief of symptoms, nonunion, malunion, hardware failure, and need for further surgical intervention.  DESCRIPTION OF PROCEDURE:  The patient was appropriately identified in the preoperative holding area and mark with a permanent marker made on the right wrist to indicate the correct operative  site.  The patient was then brought back to the operating room, placed supine on anesthesia room table where general anesthesia was administered.  The patient tolerated this well.  A well-padded tourniquet was placed in the right brachium, sealed with 1000 drape.  The right upper extremity was then prepped and draped in normal sterile fashion.  Time-out was called, correct side was identified, and procedure began.  Attention then turned to the right wrist.  A longitudinal incision was made directly over the FCR sheath.  Dissection was carried down through the skin and subcutaneous tissue.  The FCR sheath was opened proximally and distally. Going through the floor of the FCR sheath, the FCR was then released and the FPL was then exposed.  The pronator quadratus was elevated in an L- shaped fashion.  Careful release of the brachioradialis was then carried out.  Tendon release was then carried out along the radial styloid. Careful protection of first dorsal compartment was carried out and tendon tenotomy was then carried out of the brachioradialis.  Following this, the fracture site was then exposed.  This was an intra-articular fracture 3 or more fragments with comminution of the radial and central columns.  Open reduction was then performed.  Once the reduction was performed, the volar plate was then applied and then held distally with a K-wire.  Plate position was then confirmed using mini C-arm.  The oblong  screw hole was then placed proximally.  Following this, peg fixation was then carried out from an ulnar to radial direction with distal locking pegs and 1 multidirectional screw for the radial styloid. Following this, the shaft fixation was then carried out with a combination of locking and nonlocking screws.  The wound was then thoroughly irrigated.  After thorough wound irrigation, final radiographs were then obtained.  Stress testing in the distal radioulnar joint was then carried  out.  There did not appear to be instability of the distal radioulnar joint with the small ulnar styloid avulsion-type fracture.  The pronator quadratus was then repaired with 2-0 Vicryl. Subcutaneous tissues closed with 4-0 Vicryl and skin closed with 4-0 Prolene.  Adaptic dressing, sterile compressive bandage were then applied.  The patient was placed in a well-padded sugar-tong splint, extubated, and taken to the recovery room in good condition.  POSTPROCEDURAL PLAN:  The patient will be admitted overnight for IV antibiotics and pain control, discharged in the morning, seen back in the office in approximately 2 weeks for wound check, suture removal, sutures out, and application of short-arm cast, but has an order to see the therapy at the 4-week mark.  Total of 4 weeks immobilization.  Begin therapy at the 4-week mark, radiographs at 2-week mark, 4 week mark, 8- week mark.     Madelynn Done, M.D.   ______________________________ Madelynn Done, M.D.    FWO/MEDQ  D:  02/21/2016  T:  02/22/2016  Job:  [id number]

## 2016-02-26 NOTE — Anesthesia Postprocedure Evaluation (Signed)
Anesthesia Post Note  Patient: Kimberly Gates  Procedure(s) Performed: Procedure(s) (LRB): RIGHT OPEN REDUCTION INTERNAL FIXATION (ORIF) DISTAL RADIAS AND REPAIR AS INDICATED (Right)  Patient location during evaluation: PACU Anesthesia Type: General Level of consciousness: awake and alert and patient cooperative Pain management: pain level controlled Vital Signs Assessment: post-procedure vital signs reviewed and stable Respiratory status: spontaneous breathing and respiratory function stable Cardiovascular status: stable Anesthetic complications: no    Last Vitals:  Vitals:   02/21/16 2128 02/22/16 0613  BP: (!) 147/90 137/81  Pulse: 84 79  Resp: 16 16  Temp: 36.4 C 36.8 C    Last Pain:  Vitals:   02/22/16 0737  TempSrc:   PainSc: 7                  [name]

## 2016-08-29 ENCOUNTER — Other Ambulatory Visit: Payer: Self-pay | Admitting: Family Medicine

## 2016-08-29 DIAGNOSIS — Z1231 Encounter for screening mammogram for malignant neoplasm of breast: Secondary | ICD-10-CM

## 2016-09-20 ENCOUNTER — Ambulatory Visit
Admission: RE | Admit: 2016-09-20 | Discharge: 2016-09-20 | Disposition: A | Discharge status: Home / Self Care | Payer: Federal, State, Local not specified - PPO | Source: Ambulatory Visit | Attending: Family Medicine | Admitting: Family Medicine

## 2016-09-20 DIAGNOSIS — Z1231 Encounter for screening mammogram for malignant neoplasm of breast: Secondary | ICD-10-CM

## 2018-06-08 ENCOUNTER — Other Ambulatory Visit: Payer: Self-pay

## 2018-06-08 ENCOUNTER — Emergency Department (HOSPITAL_COMMUNITY): Payer: Federal, State, Local not specified - PPO

## 2018-06-08 ENCOUNTER — Encounter (HOSPITAL_COMMUNITY): Payer: Self-pay

## 2018-06-08 ENCOUNTER — Emergency Department (HOSPITAL_COMMUNITY)
Admission: EM | Admit: 2018-06-08 | Discharge: 2018-06-08 | Disposition: A | Discharge status: Home / Self Care | Payer: Federal, State, Local not specified - PPO | Attending: Emergency Medicine | Admitting: Emergency Medicine

## 2018-06-08 DIAGNOSIS — I10 Essential (primary) hypertension: Secondary | ICD-10-CM | POA: Insufficient documentation

## 2018-06-08 DIAGNOSIS — R0789 Other chest pain: Secondary | ICD-10-CM | POA: Insufficient documentation

## 2018-06-08 DIAGNOSIS — R079 Chest pain, unspecified: Secondary | ICD-10-CM

## 2018-06-08 LAB — I-STAT TROPONIN, ED
Troponin i, poc: 0 ng/mL (ref 0.00–0.08)
Troponin i, poc: 0 ng/mL (ref 0.00–0.08)

## 2018-06-08 LAB — BASIC METABOLIC PANEL
Anion gap: 15 (ref 5–15)
BUN: 16 mg/dL (ref 8–23)
CALCIUM: 9.7 mg/dL (ref 8.9–10.3)
CO2: 24 mmol/L (ref 22–32)
CREATININE: 0.62 mg/dL (ref 0.44–1.00)
Chloride: 103 mmol/L (ref 98–111)
GFR calc Af Amer: >60 mL/min (ref >60)
GFR calc non Af Amer: >60 mL/min (ref >60)
GLUCOSE: 114 mg/dL — AB (ref 70–99)
Potassium: 3.6 mmol/L (ref 3.5–5.1)
Sodium: 142 mmol/L (ref 135–145)

## 2018-06-08 LAB — CBC
HCT: 42.4 % (ref 36.0–46.0)
Hemoglobin: 13.4 g/dL (ref 12.0–15.0)
MCH: 28.7 pg (ref 26.0–34.0)
MCHC: 31.6 g/dL (ref 30.0–36.0)
MCV: 90.8 fL (ref 80.0–100.0)
NRBC: 0 % (ref 0.0–0.2)
PLATELETS: 255 10*3/uL (ref 150–400)
RBC: 4.67 MIL/uL (ref 3.87–5.11)
RDW: 13.2 % (ref 11.5–15.5)
WBC: 8.5 10*3/uL (ref 4.0–10.5)

## 2018-06-08 LAB — D-DIMER, QUANTITATIVE: D-Dimer, Quant: 1.2 ug/mL-FEU — ABNORMAL HIGH (ref 0.00–0.50)

## 2018-06-08 MED ORDER — IOPAMIDOL (ISOVUE-370) INJECTION 76%
100.0000 mL | Freq: Once | INTRAVENOUS | Status: AC | PRN
  Administered 2018-06-08: 54 mL via INTRAVENOUS

## 2018-06-08 MED ORDER — ACETAMINOPHEN 500 MG PO TABS
1000.0000 mg | ORAL_TABLET | Freq: Once | ORAL | Status: AC
  Administered 2018-06-08: 1000 mg via ORAL
  Filled 2018-06-08: qty 2

## 2018-06-08 NOTE — Discharge Instructions (Signed)
Today your

## 2018-06-08 NOTE — ED Triage Notes (Signed)
Pt here from urgent care for an abnormal EKG.  Pt complains of chest pain for 1 month.  A&Ox4 no nausea or dizziness.

## 2018-06-08 NOTE — ED Provider Notes (Signed)
[facility] Provider Note   CSN: [id number] Arrival date & time: 06/08/18  1553     History   Chief Complaint Chief Complaint  Patient presents with  . Chest Pain    HPI Kimberly Gates is a 62 y.o. female with a past medical history of hypertension, shortness of breath dyspnea, who presents today for evaluation of multiple complaints. 1.  Chest pain.  She reports that for the past month she has been having 1 to 2 seconds of sharp stabbing chest pain in the left anterior chest which then goes away.  She does not have any other symptoms during this time.  She reports having 1-2 of these episodes a day.   2.  Shortness of breath: She reports that over the past month she has had gradually worsening shortness of breath, this is gotten significantly worse over the past week.  She says that this is only when she walks, is not short of breath at rest.  Her chest pain and shortness of breath do not happen at the same time. 3.  Neck pain: She reports that since she woke up this morning her neck has been feeling tight.  She denies any numbness or tingling.  No headaches.  She has not had any recent trauma.  Patient also reports that [place]  She tells me that her brother just passed away from cardiac issues.  She says that since then she has had this cough.  No fevers nausea vomiting or diarrhea.  No abdominal pain.  HPI  Past Medical History:  Diagnosis Date  . Anemia    "long time ago"  . Arthritis   . Hypertension   . Shortness of breath dyspnea    on long walks  . Thyroid disease   . Urinary urgency     Patient Active Problem List   Diagnosis Date Noted  . Closed Colles' fracture of right radius 02/21/2016    Past Surgical History:  Procedure Laterality Date  . ABDOMINAL HYSTERECTOMY    . CESAREAN SECTION     x3  . COLONOSCOPY    . EXTERNAL EAR SURGERY    . FRACTURE SURGERY    . OPEN  REDUCTION INTERNAL FIXATION (ORIF) DISTAL RADIAL FRACTURE Right 02/21/2016  . OPEN REDUCTION INTERNAL FIXATION (ORIF) DISTAL RADIAL FRACTURE Right 02/21/2016   Procedure: RIGHT OPEN REDUCTION INTERNAL FIXATION (ORIF) DISTAL RADIAS AND REPAIR AS INDICATED;  Surgeon: Bradly BienenstockFred Ortmann, MD;  Location: [facility] OR;  Service: Orthopedics;  Laterality: Right;     OB History   No obstetric history on file.      Home Medications    Prior to Admission medications   Medication Sig Start Date End Date Taking? Authorizing Provider  benazepril (LOTENSIN) 10 MG tablet Take 10 mg by mouth daily. 12/04/15   [provider]  docusate sodium (COLACE) 100 MG capsule Take 1 capsule (100 mg total) by mouth 2 (two) times daily. 02/21/16   Bradly Bienenstockrtmann, Fred, MD  levothyroxine (SYNTHROID, LEVOTHROID) 75 MCG tablet Take 75 mcg by mouth daily before breakfast.    [provider]  methocarbamol (ROBAXIN) 500 MG tablet Take 1 tablet (500 mg total) by mouth 4 (four) times daily. 02/21/16   Bradly Bienenstockrtmann, Fred, MD  Multiple Vitamin (MULTIVITAMIN) capsule Take 1 capsule by mouth daily.    [provider]  naproxen sodium (ALEVE) 220 MG tablet Take 220 mg by mouth 2 (two) times daily as needed (for pain/sinus pain  from allergies).     [provider]  oxyCODONE-acetaminophen (PERCOCET/ROXICET) 5-325 MG tablet Take 1 tablet by mouth every 6 (six) hours as needed for severe pain. 02/18/16   Lawyer, Cristal Deer, PA-C  vitamin C (ASCORBIC ACID) 500 MG tablet Take 1 tablet (500 mg total) by mouth daily. 02/21/16   Bradly Bienenstock, MD    Family History Family History  Problem Relation Age of Onset  . Heart disease Mother   . Hypertension Mother   . Heart disease Father   . Hypertension Father   . Breast cancer Neg Hx     Social History Social History   Tobacco Use  . Smoking status: Former Smoker    Types: Cigarettes    Last attempt to quit: 2012    Years since quitting: 7.9  . Smokeless tobacco: Never Used    Substance Use Topics  . Alcohol use: Yes    Comment: socially  . Drug use: No     Allergies   Patient has no known allergies.   Review of Systems Review of Systems  Constitutional: Negative for chills and fever.  HENT: Negative for congestion.   Eyes: Negative for visual disturbance.  Respiratory: Positive for cough and shortness of breath.   Cardiovascular: Positive for chest pain. Negative for palpitations and leg swelling.  Gastrointestinal: Negative for abdominal pain, diarrhea, nausea and vomiting.  Musculoskeletal: Positive for neck pain. Negative for back pain.  Skin: Negative for color change and rash.  Neurological: Negative for dizziness.  Psychiatric/Behavioral: The patient is nervous/anxious.   All other systems reviewed and are negative.    Physical Exam Updated Vital Signs BP (!) 147/102 (BP Location: Right Arm)   Pulse 70   Temp 98.1 F (36.7 C) (Oral)   Resp 17   SpO2 98%   Physical Exam Vitals signs and nursing note reviewed.  Constitutional:      General: She is not in acute distress.    Appearance: She is well-developed.  HENT:     Head: Normocephalic and atraumatic.  Eyes:     Conjunctiva/sclera: Conjunctivae normal.  Neck:     Musculoskeletal: Normal range of motion and neck supple.     Vascular: No JVD.     Trachea: No tracheal deviation.     Comments: Mid Neck bilateral paraspinal muscle TTP.   Cardiovascular:     Rate and Rhythm: Normal rate and regular rhythm.     Heart sounds: Normal heart sounds. Heart sounds not distant. No murmur.  Pulmonary:     Effort: Pulmonary effort is normal. No tachypnea, accessory muscle usage or respiratory distress.     Breath sounds: Normal breath sounds. No stridor. No decreased breath sounds, wheezing, rhonchi or rales.  Chest:     Chest wall: No deformity, tenderness or crepitus.  Abdominal:     Palpations: Abdomen is soft.     Tenderness: There is no abdominal tenderness. There is no guarding.   Musculoskeletal:     Right lower leg: She exhibits no tenderness. No edema.     Left lower leg: She exhibits no tenderness. No edema.     Comments: Mild left anterior chest TTP.  Lymphadenopathy:     Cervical: No cervical adenopathy.  Skin:    General: Skin is warm and dry.  Neurological:     General: No focal deficit present.     Mental Status: She is alert.     Comments: Mental Status:  Alert, oriented, thought content appropriate, able to give a  coherent history. Speech fluent without evidence of aphasia. Able to follow 2 step commands without difficulty.  Cranial Nerves:  II:  Peripheral visual fields grossly normal, pupils equal, round, reactive to light III,IV, VI: ptosis not present, extra-ocular motions intact bilaterally  V,VII: smile symmetric, facial light touch sensation equal VIII: hearing grossly normal to voice  X: uvula elevates symmetrically  XI: bilateral shoulder shrug symmetric and strong XII: midline tongue extension without fassiculations Motor:  Normal tone. 5/5 in upper and lower extremities bilaterally including strong and equal grip strength and dorsiflexion/plantar flexion Cerebellar: normal finger-to-nose with bilateral upper extremities Gait: normal gait and balance CV: distal pulses palpable throughout    Psychiatric:        Mood and Affect: Mood normal.        Behavior: Behavior normal.      ED Treatments / Results  Labs (all labs ordered are listed, but only abnormal results are displayed) Labs Reviewed  BASIC METABOLIC PANEL - Abnormal; Notable for the following components:      Result Value   Glucose, Bld 114 (*)    All other components within normal limits  D-DIMER, QUANTITATIVE (NOT AT [facility]) - Abnormal; Notable for the following components:   D-Dimer, Quant 1.20 (*)    All other components within normal limits  CBC  I-STAT TROPONIN, ED  I-STAT TROPONIN, ED    EKG EKG Interpretation  Date/Time:  Monday June 08 2018 16:01:04  EST Ventricular Rate:  82 PR Interval:  142 QRS Duration: 76 QT Interval:  364 QTC Calculation: 425 R Axis:   6 Text Interpretation:  Normal sinus rhythm Nonspecific T wave abnormality Abnormal ECG No significant change since last tracing Confirmed by Jacalyn LefevreHaviland, Julie [phone]([id number]) on 06/08/2018 6:00:17 PM   Radiology Dg Chest 2 View  Result Date: 06/08/2018 CLINICAL DATA:  Chest pain. Dizziness. EXAM: CHEST - 2 VIEW COMPARISON:  None. FINDINGS: Midline trachea. Borderline cardiomegaly. Tortuous thoracic aorta. No pleural effusion or pneumothorax. No congestive failure. There may be minimal scarring or subsegmental atelectasis at the medial left lung base. No correlate on the lateral. IMPRESSION: Borderline cardiomegaly, without acute disease. Electronically Signed   By: Jeronimo GreavesKyle  Talbot M.D.   On: 06/08/2018 16:49   Ct Angio Chest Pe W/cm &/or Wo Cm  Result Date: 06/08/2018 CLINICAL DATA:  Chest pain and pressure for 1 month with abnormal EKG EXAM: CT ANGIOGRAPHY CHEST WITH CONTRAST TECHNIQUE: Multidetector CT imaging of the chest was performed using the standard protocol during bolus administration of intravenous contrast. Multiplanar CT image reconstructions and MIPs were obtained to evaluate the vascular anatomy. CONTRAST:  54mL ISOVUE-370 IOPAMIDOL (ISOVUE-370) INJECTION 76% COMPARISON:  Plain film from earlier in the same day. FINDINGS: Cardiovascular: Thoracic aorta demonstrates mild atherosclerotic calcifications. No aneurysmal dilatation is seen. No cardiac enlargement is noted. The pulmonary artery shows a normal branching pattern. No filling defects are identified to suggest pulmonary embolism. Mild coronary calcifications are seen. Mediastinum/Nodes: Thoracic inlet is within normal limits. No hilar or mediastinal adenopathy is noted. The esophagus is within normal limits. Lungs/Pleura: Lungs are well aerated bilaterally without focal infiltrate or sizable effusion. No parenchymal nodules are  seen. Upper Abdomen: No acute abnormality. Musculoskeletal: Degenerative changes of the thoracic spine are noted. Review of the MIP images confirms the above findings. IMPRESSION: No evidence pulmonary emboli. No focal infiltrates seen. Aortic Atherosclerosis (ICD10-I70.0). Electronically Signed   By: Alcide CleverMark  Lukens M.D.   On: 06/08/2018 21:02    Procedures Procedures (including critical care time)  Medications Ordered in ED Medications  acetaminophen (TYLENOL) tablet 1,000 mg (1,000 mg Oral Given 06/08/18 1941)  iopamidol (ISOVUE-370) 76 % injection 100 mL (54 mLs Intravenous Contrast Given 06/08/18 2042)     Initial Impression / Assessment and Plan / ED Course  I have reviewed the triage vital signs and the nursing notes.  Pertinent labs & imaging results that were available during my care of the patient were reviewed by me and considered in my medical decision making (see chart for details).  Clinical Course as of Jun 08 2230  Mon Jun 08, 2018  1610 Results explained to patient.  She does not think she is ever had CT contrast before, does not know of allergy to it.  CTA PE study ordered.  D-Dimer, Quant(!): 1.20 [EH]    Clinical Course User Index [EH] Cristina Gong, PA-C   Patient presents today primarily for evaluation of chest pain.  Her chest pain is atypical in nature and lasts only a few seconds before going away.  This is been intermittently going on for approximately 1 month.  Chest x-ray does not show acute abnormality.  Troponin x2 is not elevated.  EKG shows nonspecific T wave abnormality.  D-dimer was elevated.  CT PE study does not show evidence of PE, or other acute abnormality.  Suspect that patient's chest pain is more consistent with musculoskeletal pain versus precordial catch syndrome.  Heart score is 3.  Patient does not have a murmur, no evidence of JVD.  Recommended PCP follow-up.  Shortness of breath: Patient reports shortness of breath for approximately 1  month, primarily when she is walking and moving.  CT PE study does not show evidence of pulmonary edema or pulmonary embolism.  Do not suspect emergent cause for this.  If her symptoms were caused by heart failure I would expect to see some evidence of pulmonary edema on her CT scan.  Neck pain: Patient is neurologically intact.  This resolved after Tylenol while in the emergency room.  Suspect musculoskeletal pain.  No fevers, neck rigidity or evidence of meningeal signs.  Return precautions were discussed with patient who states their understanding.  At the time of discharge patient denied any unaddressed complaints or concerns.  Patient is agreeable for discharge home.   Final Clinical Impressions(s) / ED Diagnoses   Final diagnoses:  Chest pain, unspecified type  Essential hypertension    ED Discharge Orders    None       Norman Clay 06/08/18 2235    Jacalyn Lefevre, MD 06/09/18 0004

## 2018-06-08 NOTE — ED Notes (Signed)
Spoke to CT, patient 3rd in line.

## 2018-06-08 NOTE — ED Notes (Signed)
Patient transported to CT 

## 2018-06-08 NOTE — ED Notes (Signed)
Patient verbalizes understanding of discharge instructions. Opportunity for questioning and answers were provided. Armband removed by staff, pt discharged from ED.  

## 2018-06-15 ENCOUNTER — Other Ambulatory Visit (HOSPITAL_COMMUNITY): Payer: Self-pay | Admitting: Family Medicine

## 2018-06-15 ENCOUNTER — Other Ambulatory Visit: Payer: Self-pay | Admitting: Family Medicine

## 2018-06-15 ENCOUNTER — Ambulatory Visit (HOSPITAL_COMMUNITY): Payer: Federal, State, Local not specified - PPO | Attending: Cardiology

## 2018-06-15 ENCOUNTER — Other Ambulatory Visit: Payer: Self-pay

## 2018-06-15 DIAGNOSIS — R011 Cardiac murmur, unspecified: Secondary | ICD-10-CM | POA: Diagnosis present

## 2018-06-15 DIAGNOSIS — R079 Chest pain, unspecified: Secondary | ICD-10-CM

## 2018-06-18 ENCOUNTER — Other Ambulatory Visit: Payer: Self-pay | Admitting: Physician Assistant

## 2018-06-18 ENCOUNTER — Ambulatory Visit: Payer: Self-pay

## 2018-06-18 DIAGNOSIS — R079 Chest pain, unspecified: Secondary | ICD-10-CM

## 2019-08-08 ENCOUNTER — Emergency Department (HOSPITAL_COMMUNITY): Payer: No Typology Code available for payment source

## 2019-08-08 ENCOUNTER — Other Ambulatory Visit: Payer: Self-pay

## 2019-08-08 ENCOUNTER — Encounter (HOSPITAL_COMMUNITY): Payer: Self-pay | Admitting: Emergency Medicine

## 2019-08-08 ENCOUNTER — Emergency Department (HOSPITAL_COMMUNITY)
Admission: EM | Admit: 2019-08-08 | Discharge: 2019-08-08 | Disposition: A | Discharge status: Home / Self Care | Payer: No Typology Code available for payment source | Attending: Emergency Medicine | Admitting: Emergency Medicine

## 2019-08-08 DIAGNOSIS — Y9389 Activity, other specified: Secondary | ICD-10-CM | POA: Insufficient documentation

## 2019-08-08 DIAGNOSIS — I10 Essential (primary) hypertension: Secondary | ICD-10-CM | POA: Insufficient documentation

## 2019-08-08 DIAGNOSIS — Z79899 Other long term (current) drug therapy: Secondary | ICD-10-CM | POA: Insufficient documentation

## 2019-08-08 DIAGNOSIS — Y999 Unspecified external cause status: Secondary | ICD-10-CM | POA: Insufficient documentation

## 2019-08-08 DIAGNOSIS — S0990XA Unspecified injury of head, initial encounter: Secondary | ICD-10-CM

## 2019-08-08 DIAGNOSIS — Y929 Unspecified place or not applicable: Secondary | ICD-10-CM | POA: Diagnosis not present

## 2019-08-08 DIAGNOSIS — F17210 Nicotine dependence, cigarettes, uncomplicated: Secondary | ICD-10-CM | POA: Diagnosis not present

## 2019-08-08 DIAGNOSIS — S0083XA Contusion of other part of head, initial encounter: Secondary | ICD-10-CM

## 2019-08-08 DIAGNOSIS — W2209XA Striking against other stationary object, initial encounter: Secondary | ICD-10-CM | POA: Diagnosis not present

## 2019-08-08 MED ORDER — ACETAMINOPHEN 325 MG PO TABS
650.0000 mg | ORAL_TABLET | Freq: Once | ORAL | Status: AC
  Administered 2019-08-08: 650 mg via ORAL
  Filled 2019-08-08: qty 2

## 2019-08-08 NOTE — Discharge Instructions (Addendum)
Tylenol or Ibuprofen as needed for pain. It is likely as the hematoma drains that you will have progressive bruising down your face. This is normal and will fade over time.  Follow up with PCP for hospital follow up or return to the ED for new or worsening symptoms.

## 2019-08-08 NOTE — ED Notes (Signed)
Pt back from CT

## 2019-08-08 NOTE — ED Provider Notes (Signed)
[facility] EMERGENCY DEPARTMENT Provider Note   CSN: [id number] Arrival date & time: 08/08/19  1135    History Chief Complaint  Patient presents with   Head Injury    Careena Degraffenreid is a 64 y.o. female with past medical history significant for HTN, anemia who presents for evaluation of head injury after mechanical fall.  She states her husband went to fall on Friday approximate 11 AM.  She reached her hands out to catch him and then hit her head on a metal pole. She did not fall to ground. EMS evaluated her at that time.  Patient states she has had a hematoma to her right forehead.  She woke up yesterday and noticed some bruising to her bilateral upper eyelids.  Patient denies any pain.  She denies falling, loss of consciousness or anticoagulation.  No headache, lightheadedness, dizziness, eye pain, vision changes, chest pain, shortness of breath abdominal pain, paresthesias, decreased range of motion, redness or warmth to extremities.  Patient states she does have some mild midline neck pain however states this is intermittently chronic in nature due to her posture at work on the computer all day.  Pain is 0 out of 10.  No radicular symptoms.  No red flags for back pain.  Denies additional aggravating or alleviating factors. No preceding syncope, CP, SOB, HA, dizziness.  No laceration or open skin. Up to date on tetanus  History obtained from patient and past medical records.  No interpreter is used   HPI     Past Medical History:  Diagnosis Date   Anemia    "long time ago"   Arthritis    Hypertension    Shortness of breath dyspnea    on long walks   Thyroid disease    Urinary urgency     Patient Active Problem List   Diagnosis Date Noted   Closed Colles' fracture of right radius 02/21/2016    Past Surgical History:  Procedure Laterality Date   ABDOMINAL HYSTERECTOMY     CESAREAN SECTION     x3   COLONOSCOPY     EXTERNAL EAR SURGERY      FRACTURE SURGERY     OPEN REDUCTION INTERNAL FIXATION (ORIF) DISTAL RADIAL FRACTURE Right 02/21/2016   OPEN REDUCTION INTERNAL FIXATION (ORIF) DISTAL RADIAL FRACTURE Right 02/21/2016   Procedure: RIGHT OPEN REDUCTION INTERNAL FIXATION (ORIF) DISTAL RADIAS AND REPAIR AS INDICATED;  Surgeon: Bradly Bienenstock, MD;  Location: [facility] OR;  Service: Orthopedics;  Laterality: Right;     OB History   No obstetric history on file.     Family History  Problem Relation Age of Onset   Heart disease Mother    Hypertension Mother    Heart disease Father    Hypertension Father    Breast cancer Neg Hx     Social History   Tobacco Use   Smoking status: Current Some Day Smoker    Types: Cigarettes    Last attempt to quit: 2012    Years since quitting: 9.1   Smokeless tobacco: Never Used  Substance Use Topics   Alcohol use: Yes    Comment: socially   Drug use: No    Home Medications Prior to Admission medications   Medication Sig Start Date End Date Taking? Authorizing Provider  benazepril (LOTENSIN) 10 MG tablet Take 10 mg by mouth daily. 12/04/15   [provider]  docusate sodium (COLACE) 100 MG capsule Take 1 capsule (100 mg total) by mouth 2 (two) times  daily. 02/21/16   Bradly Bienenstock, MD  levothyroxine (SYNTHROID, LEVOTHROID) 75 MCG tablet Take 75 mcg by mouth daily before breakfast.    [provider]  methocarbamol (ROBAXIN) 500 MG tablet Take 1 tablet (500 mg total) by mouth 4 (four) times daily. 02/21/16   Bradly Bienenstock, MD  Multiple Vitamin (MULTIVITAMIN) capsule Take 1 capsule by mouth daily.    [provider]  naproxen sodium (ALEVE) 220 MG tablet Take 220 mg by mouth 2 (two) times daily as needed (for pain/sinus pain from allergies).     [provider]  oxyCODONE-acetaminophen (PERCOCET/ROXICET) 5-325 MG tablet Take 1 tablet by mouth every 6 (six) hours as needed for severe pain. 02/18/16   Lawyer, Cristal Deer, PA-C  vitamin C (ASCORBIC  ACID) 500 MG tablet Take 1 tablet (500 mg total) by mouth daily. 02/21/16   Bradly Bienenstock, MD    Allergies    Patient has no known allergies.  Review of Systems   Review of Systems  Constitutional: Negative.   HENT: Negative.   Respiratory: Negative.   Cardiovascular: Negative.   Gastrointestinal: Negative.   Genitourinary: Negative.   Musculoskeletal: Positive for neck pain (Chronic).  Skin: Positive for wound.  Neurological: Negative.   All other systems reviewed and are negative.   Physical Exam Updated Vital Signs BP (!) 163/87 (BP Location: Right Arm)    Pulse 72    Temp 98.3 F (36.8 C) (Oral)    Resp 14    SpO2 100%   Physical Exam Vitals and nursing note reviewed.  Constitutional:      General: She is not in acute distress.    Appearance: She is well-developed. She is not ill-appearing or toxic-appearing.  HENT:     Head: Normocephalic. No Battle's sign, right periorbital erythema or left periorbital erythema.     Jaw: There is normal jaw occlusion.      Comments: 5 cm rounded hematoma to right forehead.  No crepitus, step-offs to facial bones.  No jaw occlusion.  Patient with purple ecchymosis to bilateral upper eyelids.  No lacerations, open skin    Right Ear: No hemotympanum.     Left Ear: No hemotympanum.     Ears:     Comments: No hemotympanum    Nose: Nose normal.     Comments: No crepitus, step-offs.  No septal hematoma    Mouth/Throat:     Lips: Pink.     Comments: Dentition intact.  No oral lesions, lacerations.  No drooling, dysphagia or trismus Eyes:     General: Vision grossly intact.     Extraocular Movements: Extraocular movements intact.     Conjunctiva/sclera: Conjunctivae normal.     Pupils: Pupils are equal, round, and reactive to light.     Visual Fields: Right eye visual fields normal and left eye visual fields normal.     Comments: EOMs intact.  Neck:     Trachea: Trachea and phonation normal.      Comments: Mild paraspinal tenderness  over C4.  No crepitus, step-offs.  Patient declined c-collar Cardiovascular:     Rate and Rhythm: Normal rate.     Pulses: Normal pulses.          Dorsalis pedis pulses are 2+ on the right side and 2+ on the left side.     Heart sounds: Normal heart sounds.  Pulmonary:     Effort: Pulmonary effort is normal. No respiratory distress.     Breath sounds: Normal breath sounds and air entry.  Chest:     Comments: No chest wall crepitus or step-off Abdominal:     General: There is no distension.     Palpations: Abdomen is soft.     Tenderness: There is no abdominal tenderness.     Comments: Soft, nontender.  No evidence of traumatic injury to abdominal wall  Musculoskeletal:        General: Normal range of motion.     Cervical back: Full passive range of motion without pain, normal range of motion and neck supple.     Comments: Moves all 4 extremities without difficulty.  No bony tenderness.  Compartments soft.  No midline spinal tenderness to thoracic or lumbar spine.  Skin:    General: Skin is warm and dry.     Comments: Brisk capillary refill.  No edema, erythema or warmth.  No fluctuance or induration.  No lacerations or evidence of broken skin.  Ecchymosis to bilateral upper eyelids as well as hematoma to right forehead.  Neurological:     General: No focal deficit present.     Mental Status: She is alert.     Cranial Nerves: Cranial nerves are intact.     Sensory: Sensation is intact.     Motor: Motor function is intact.     Coordination: Coordination is intact.     Gait: Gait is intact.     Comments: Cranial nerves II through XII grossly intact.  Ambulatory from restroom into room without difficulty     ED Results / Procedures / Treatments   Labs (all labs ordered are listed, but only abnormal results are displayed) Labs Reviewed - No data to display  EKG None  Radiology CT Head Wo Contrast  Result Date: 08/08/2019 CLINICAL DATA:  Fall, head/face injury, forehead  hematoma, headache EXAM: CT HEAD WITHOUT CONTRAST CT MAXILLOFACIAL WITHOUT CONTRAST CT CERVICAL SPINE WITHOUT CONTRAST TECHNIQUE: Multidetector CT imaging of the head, cervical spine, and maxillofacial structures were performed using the standard protocol without intravenous contrast. Multiplanar CT image reconstructions of the cervical spine and maxillofacial structures were also generated. COMPARISON:  None. FINDINGS: CT HEAD FINDINGS Brain: No evidence of acute infarction, hemorrhage, hydrocephalus, extra-axial collection or mass lesion/mass effect. Vascular: Intracranial atherosclerosis. Skull: Normal. Negative for fracture or focal lesion. Other: Small extracranial hematoma overlying the right frontal bone (series 3/image 16). Associated mild soft tissue swelling. CT MAXILLOFACIAL FINDINGS Osseous: No evidence of maxillofacial fracture. Mandible is intact. Bilateral mandibular condyles are well-seated in the TMJs. Orbits: Bilateral orbits, including the globes and retroconal soft tissues, within normal limits. Sinuses: The visualized paranasal sinuses are essentially clear. Right mastoid air cells are unopacified. Left mastoid air cells are underpneumatized. Soft tissues: Mild soft tissue swelling overlying the frontal bone (series 3/image 39). CT CERVICAL SPINE FINDINGS Alignment: Reversal of the normal cervical doses, likely positional. Skull base and vertebrae: No acute fracture. No primary bone lesion or focal pathologic process. 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 Soft tissues and spinal canal: No prevertebral fluid or swelling. No visible canal hematoma. Disc levels: Mild degenerative changes at C5-6 and C6-7. Spinal canal is patent. Upper chest: Mild right apical pleural-parenchymal scarring. Other: Visualized thyroid is unremarkable. IMPRESSION: Small stranding hematoma overlying the right frontal bone with associated  mild soft tissue swelling. No evidence of calvarial fracture. No evidence of acute intracranial abnormality. No evidence of maxillofacial fracture. No evidence of traumatic injury to the cervical spine. 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 Mild degenerative changes of the lower cervical spine. Electronically Signed   By: Charline Bills M.D.   On: 08/08/2019 13:33   CT Cervical Spine Wo Contrast  Result Date: 08/08/2019 CLINICAL DATA:  Fall, head/face injury, forehead hematoma, headache EXAM: CT HEAD WITHOUT CONTRAST CT MAXILLOFACIAL WITHOUT CONTRAST CT CERVICAL SPINE WITHOUT CONTRAST TECHNIQUE: Multidetector CT imaging of the head, cervical spine, and maxillofacial structures were performed using the standard protocol without intravenous contrast. Multiplanar CT image reconstructions of the cervical spine and maxillofacial structures were also generated. COMPARISON:  None. FINDINGS: CT HEAD FINDINGS Brain: No evidence of acute infarction, hemorrhage, hydrocephalus, extra-axial collection or mass lesion/mass effect. Vascular: Intracranial atherosclerosis. Skull: Normal. Negative for fracture or focal lesion. Other: Small extracranial hematoma overlying the right frontal bone (series 3/image 16). Associated mild soft tissue swelling. CT MAXILLOFACIAL FINDINGS Osseous: No evidence of maxillofacial fracture. Mandible is intact. Bilateral mandibular condyles are well-seated in the TMJs. Orbits: Bilateral orbits, including the globes and retroconal soft tissues, within normal limits. Sinuses: The visualized paranasal sinuses are essentially clear. Right mastoid air cells are unopacified. Left mastoid air cells are underpneumatized. Soft tissues: Mild soft tissue swelling overlying the frontal bone (series 3/image 39). CT CERVICAL SPINE FINDINGS Alignment: Reversal of the normal cervical doses, likely positional. Skull base and vertebrae: No acute fracture. No primary bone  lesion or focal pathologic process. 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 Soft tissues and spinal canal: No prevertebral fluid or swelling. No visible canal hematoma. Disc levels: Mild degenerative changes at C5-6 and C6-7. Spinal canal is patent. Upper chest: Mild right apical pleural-parenchymal scarring. Other: Visualized thyroid is unremarkable. IMPRESSION: Small stranding hematoma overlying the right frontal bone with associated mild soft tissue swelling. No evidence of calvarial fracture. No evidence of acute intracranial abnormality. No evidence of maxillofacial fracture. No evidence of traumatic injury to the cervical spine. 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 Mild degenerative changes of the lower cervical spine. Electronically Signed   By: Charline Bills M.D.   On: 08/08/2019 13:33   CT Maxillofacial Wo Contrast  Result Date: 08/08/2019 CLINICAL DATA:  Fall, head/face injury, forehead hematoma, headache EXAM: CT HEAD WITHOUT CONTRAST CT MAXILLOFACIAL WITHOUT CONTRAST CT CERVICAL SPINE WITHOUT CONTRAST TECHNIQUE: Multidetector CT imaging of the head, cervical spine, and maxillofacial structures were performed using the standard protocol without intravenous contrast. Multiplanar CT image reconstructions of the cervical spine and maxillofacial structures were also generated. COMPARISON:  None. FINDINGS: CT HEAD FINDINGS Brain: No evidence of acute infarction, hemorrhage, hydrocephalus, extra-axial collection or mass lesion/mass effect. Vascular: Intracranial atherosclerosis. Skull: Normal. Negative for fracture or focal lesion. Other: Small extracranial hematoma overlying the right frontal bone (series 3/image 16). Associated mild soft tissue swelling. CT MAXILLOFACIAL FINDINGS Osseous: No evidence of maxillofacial fracture. Mandible is intact. Bilateral mandibular condyles  are well-seated in the TMJs. Orbits: Bilateral orbits, including the globes and retroconal soft tissues, within normal limits. Sinuses: The visualized paranasal sinuses are essentially clear. Right mastoid air cells are unopacified. Left mastoid air cells are underpneumatized. Soft tissues: Mild soft tissue swelling overlying the frontal bone (series 3/image 39). CT CERVICAL SPINE FINDINGS Alignment: Reversal of the normal cervical doses, likely positional. Skull base and vertebrae: No acute fracture. No primary bone lesion or focal pathologic process. 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 Soft tissues and spinal canal: No prevertebral fluid or swelling. No visible canal hematoma. Disc levels: Mild degenerative changes at C5-6 and C6-7. Spinal canal is patent. Upper chest: Mild right apical pleural-parenchymal scarring. Other: Visualized thyroid is unremarkable. IMPRESSION: Small stranding hematoma overlying the right frontal bone with associated mild soft tissue swelling. No evidence of calvarial fracture. No evidence of acute intracranial abnormality. No evidence of maxillofacial fracture. No evidence of traumatic injury to the cervical spine. 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 Mild degenerative changes of the lower cervical spine. Electronically Signed   By: Julian Hy M.D.   On: 08/08/2019 13:33    Procedures Procedures (including critical care time)  Medications Ordered in ED Medications  acetaminophen (TYLENOL) tablet 650 mg (650 mg Oral Given 08/08/19 1355)    ED Course  I have reviewed the triage vital signs and the nursing notes.  Pertinent labs & imaging results that were available during my care of the patient were reviewed by me and considered in my medical decision making (see chart for details).  64 year old female appears otherwise well presents for  evaluation of head injury which occurred 2 days PTA.  No LOC, anticoagulation.  Neuromusculoskeletal exam.  Neurovascularly intact.  Tetanus up-to-date however no evidence of broken skin on exam.  Patient with hematoma to right forehead and some ecchymosis to bilateral upper eyelids.  EOMs intact.  No evidence of ocular trauma.  She does have some mild tenderness to paraspinal muscles over C3.  She declined c-collar.  States she does have some chronic neck pain from sitting at a computer daily.  Plan on obtaining CT images and reevaluation.  She is ambulatory that difficulty  CT imaging personally reviewed with scalp hematoma, intracranial, bony abnormality. Patient tolerating PO intake and ambulatory without difficulty. She continues to be NV intact throughout her ED stay. Ecchymosis to bilateral upper eye lid likely from drainage from forehead hematoma. Discussed symptomatic management at home. She is to follow up with PCP or return to the ED for new or worsening symptoms.  The patient has been appropriately medically screened and/or stabilized in the ED. I have low suspicion for any other emergent medical condition which would require further screening, evaluation or treatment in the ED or require inpatient management.    MDM Rules/Calculators/A&P                       Final Clinical Impression(s) / ED Diagnoses Final diagnoses:  Injury of head, initial encounter  Traumatic hematoma of forehead, initial encounter    Rx / DC Orders ED Discharge Orders    None       Harriet Bollen A, PA-C 08/08/19 1359    Fredia Sorrow, MD 08/10/19 1540

## 2019-08-08 NOTE — ED Notes (Signed)
Patient ambulated to restroom with steady gait.

## 2019-08-08 NOTE — ED Triage Notes (Signed)
Pt hit head on post in kitchen on Friday.  C/o hematoma to R forehead and bruising to bilateral eyes.  Denies pain.  Denies LOC.

## 2019-08-08 NOTE — ED Notes (Signed)
ED Provider at bedside. 

## 2019-08-08 NOTE — ED Notes (Signed)
Patient transported to CT 

## 2020-01-03 ENCOUNTER — Other Ambulatory Visit: Payer: Self-pay | Admitting: Family Medicine

## 2020-01-03 DIAGNOSIS — Z1231 Encounter for screening mammogram for malignant neoplasm of breast: Secondary | ICD-10-CM

## 2020-01-17 ENCOUNTER — Other Ambulatory Visit: Payer: Self-pay

## 2020-01-17 ENCOUNTER — Ambulatory Visit
Admission: RE | Admit: 2020-01-17 | Discharge: 2020-01-17 | Disposition: A | Discharge status: Home / Self Care | Payer: No Typology Code available for payment source | Source: Ambulatory Visit | Attending: Family Medicine | Admitting: Family Medicine

## 2020-01-17 DIAGNOSIS — Z1231 Encounter for screening mammogram for malignant neoplasm of breast: Secondary | ICD-10-CM

## 2021-02-08 IMAGING — MG DIGITAL SCREENING BILAT W/ TOMO W/ CAD
6 of 10 series · 6 of 30 positions shown · non-contrast
Comparison: Previous exam(s).

CLINICAL DATA: Screening.

EXAM:
DIGITAL SCREENING BILATERAL MAMMOGRAM WITH TOMO AND CAD

[R CC synth-2D]
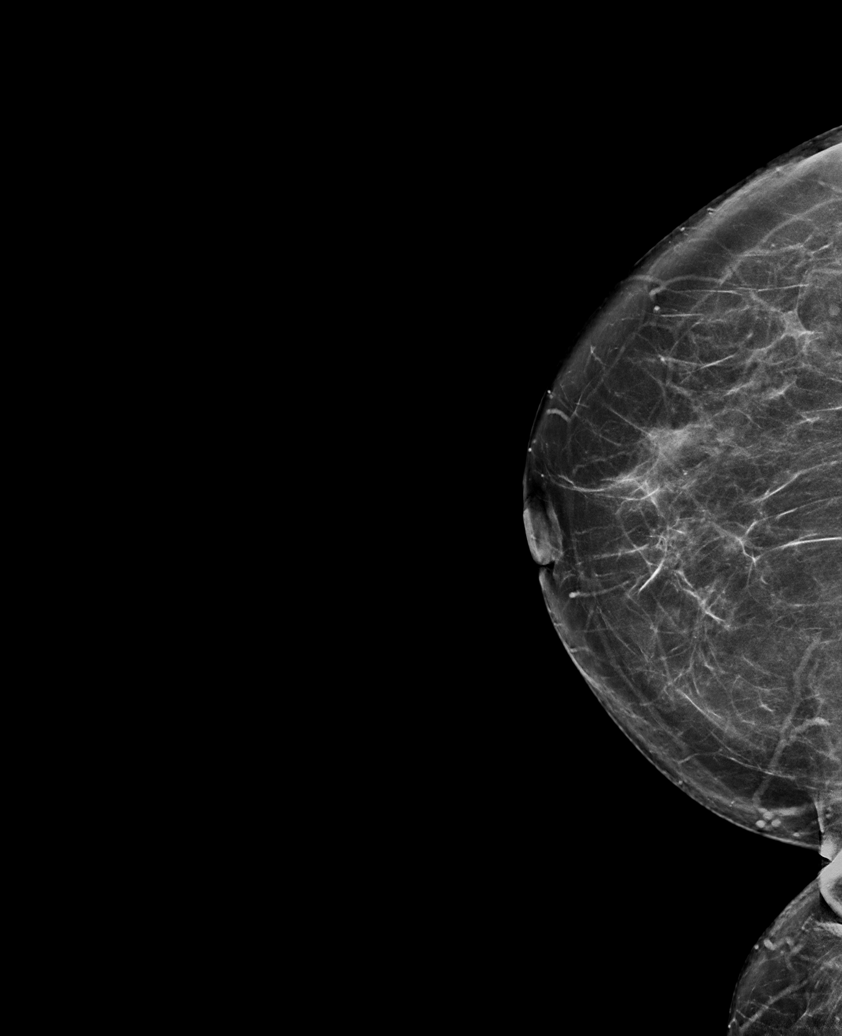

[L MLO synth-2D]
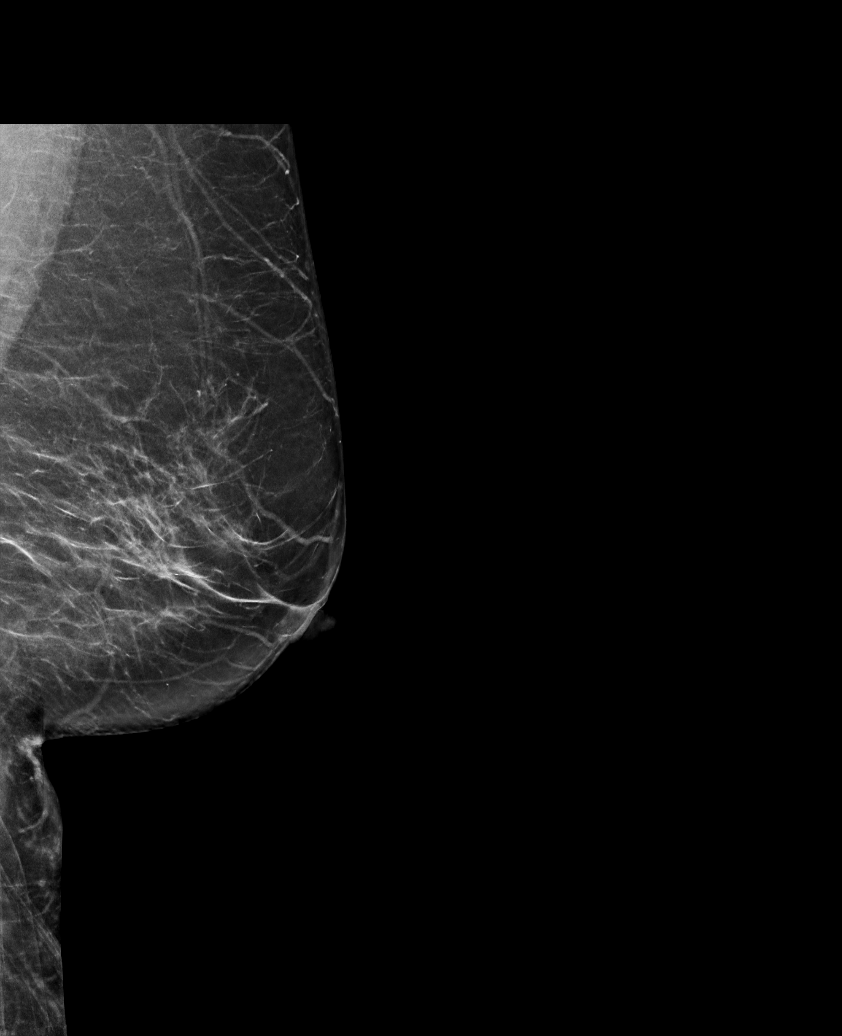

[R MLO synth-2D (1 of 2)]
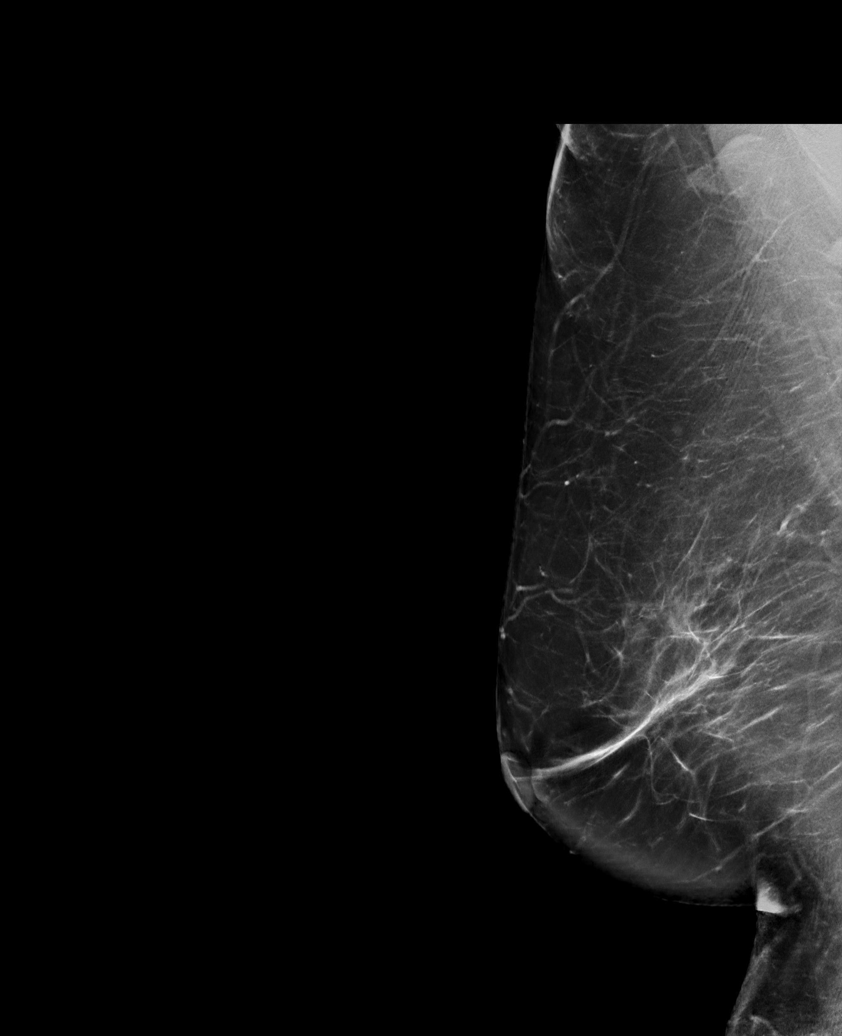

[R MLO synth-2D (2 of 2)]
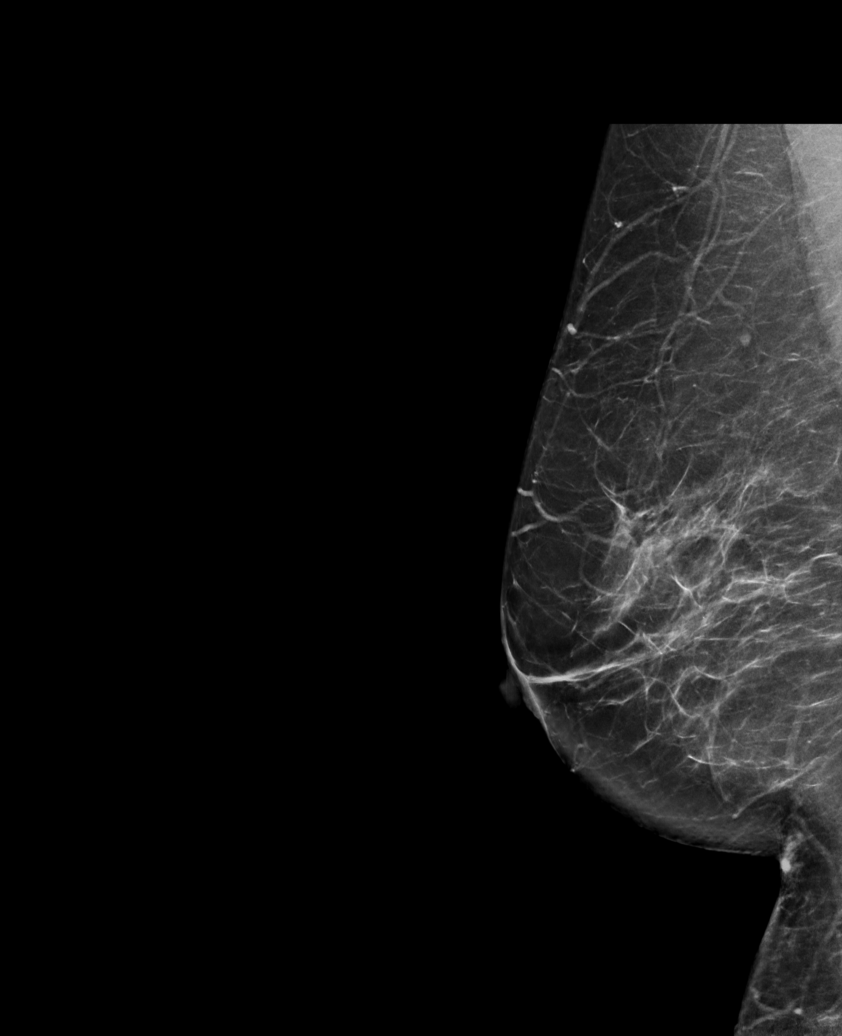

[L CC synth-2D]
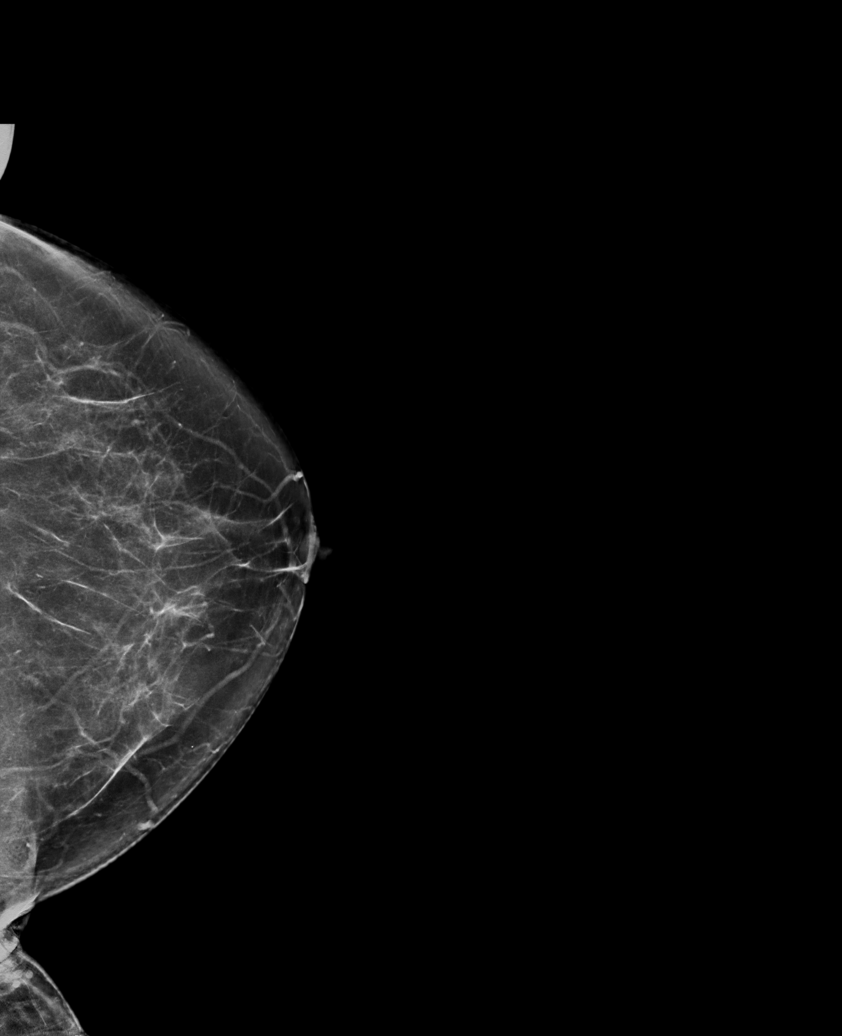

[R CC tomo · tomo slice 39/76.0]
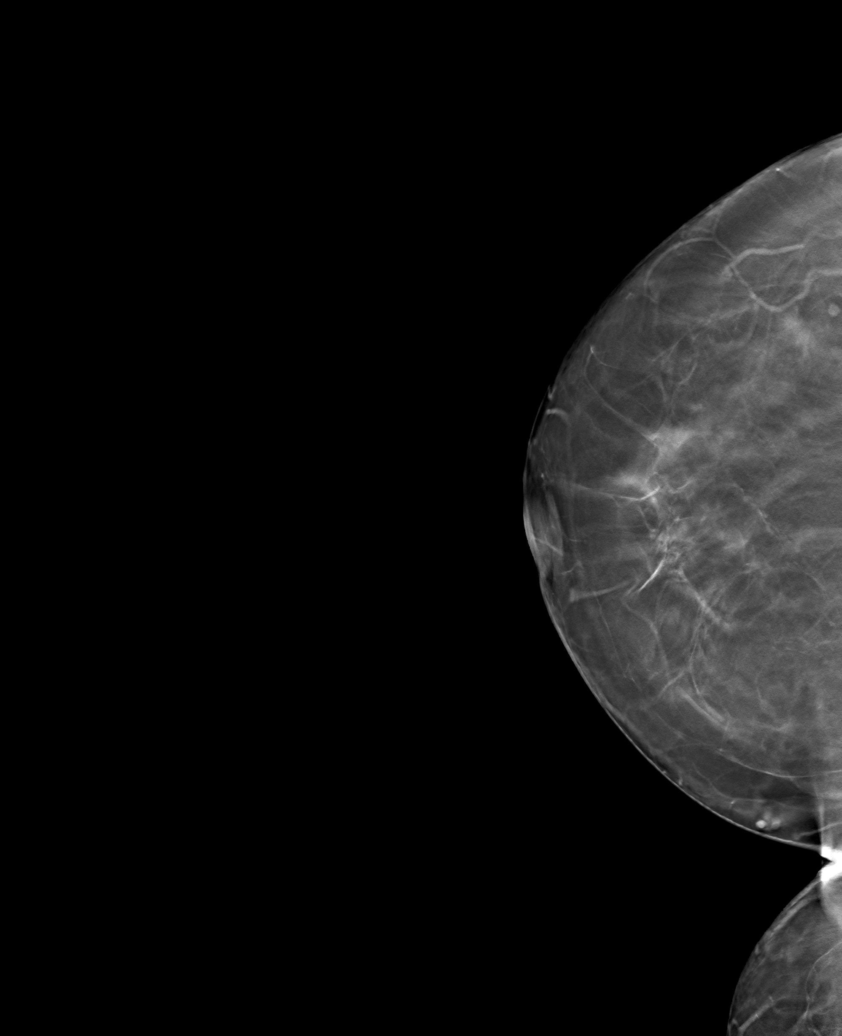

[6 of 30 positions shown; findings below may reference images not displayed]

ACR Breast Density Category b: There are scattered areas of
fibroglandular density.
FINDINGS: There are no findings suspicious for malignancy. Images were
processed with CAD.
IMPRESSION: No mammographic evidence of malignancy. A result letter of this
screening mammogram will be mailed directly to the patient.

RECOMMENDATION:
Screening mammogram in one year. (Code:CN-U-775)

BI-RADS CATEGORY  1: Negative.

## 2021-07-18 ENCOUNTER — Other Ambulatory Visit: Payer: Self-pay | Admitting: Family Medicine

## 2021-07-18 DIAGNOSIS — Z13820 Encounter for screening for osteoporosis: Secondary | ICD-10-CM

## 2021-07-18 DIAGNOSIS — Z1231 Encounter for screening mammogram for malignant neoplasm of breast: Secondary | ICD-10-CM

## 2021-12-14 ENCOUNTER — Inpatient Hospital Stay: Admission: RE | Admit: 2021-12-14 | Payer: No Typology Code available for payment source | Source: Ambulatory Visit

## 2021-12-14 ENCOUNTER — Ambulatory Visit: Payer: No Typology Code available for payment source

## 2022-01-08 ENCOUNTER — Ambulatory Visit
Admission: RE | Admit: 2022-01-08 | Discharge: 2022-01-08 | Disposition: A | Discharge status: Home / Self Care | Payer: No Typology Code available for payment source | Source: Ambulatory Visit | Attending: Family Medicine | Admitting: Family Medicine

## 2022-01-08 DIAGNOSIS — Z1231 Encounter for screening mammogram for malignant neoplasm of breast: Secondary | ICD-10-CM

## 2022-08-21 ENCOUNTER — Other Ambulatory Visit: Payer: Self-pay | Admitting: Internal Medicine

## 2022-08-21 DIAGNOSIS — Z13820 Encounter for screening for osteoporosis: Secondary | ICD-10-CM

## 2022-09-27 ENCOUNTER — Ambulatory Visit: Payer: No Typology Code available for payment source | Admitting: Podiatry

## 2022-09-27 ENCOUNTER — Encounter: Payer: Self-pay | Admitting: Podiatry

## 2022-09-27 DIAGNOSIS — B353 Tinea pedis: Secondary | ICD-10-CM | POA: Diagnosis not present

## 2022-09-27 DIAGNOSIS — B351 Tinea unguium: Secondary | ICD-10-CM | POA: Diagnosis not present

## 2022-09-27 DIAGNOSIS — L84 Corns and callosities: Secondary | ICD-10-CM | POA: Diagnosis not present

## 2022-09-27 MED ORDER — TERBINAFINE HCL 250 MG PO TABS: 250.0000 mg | ORAL_TABLET | Freq: Every day | ORAL | 0 refills | Status: DC

## 2022-09-27 MED ORDER — KETOCONAZOLE 2 % EX CREA: 1.0000 | TOPICAL_CREAM | Freq: Every day | CUTANEOUS | 0 refills | Status: DC

## 2022-09-27 NOTE — Progress Notes (Signed)
  Subjective:  Patient ID: Kimberly Gates, female    DOB: 1956/03/06,  MRN: [id number]  Chief Complaint  Patient presents with   Foot Problem    bil Calluses and tinea pedis, patient has had no prior treatment other than OTC medications and pedicures, was ref by PCP    67 y.o. female presents with concern for bilateral rash redness itching pain on both feet.  Has also noticed some new skin lesions and fissuring in both foot.  Also with concern for disc colored and abnormally growing nails.  Has tried over-the-counter treatments with no effect.  Past Medical History:  Diagnosis Date   Anemia    "long time ago"   Arthritis    Hypertension    Shortness of breath dyspnea    on long walks   Thyroid disease    Urinary urgency     No Known Allergies  ROS: Negative except as per HPI above  Objective:  General: AAO x3, NAD  Dermatological: Attention or to both feet there is noted to be dry scaling skin red rash and skin fissuring.  Nails are thickened elongated and dystrophic x 5 both feet.  Painful hyperkeratotic lesion right midfoot and left plantar first MPJ.  Vascular:  Dorsalis Pedis artery and Posterior Tibial artery pedal pulses are 2/4 bilateral.  Capillary fill time < 3 sec to all digits.   Neruologic: Grossly intact via light touch bilateral. Protective threshold intact to all sites bilateral.   Musculoskeletal: No gross boney pedal deformities bilateral. No pain, crepitus, or limitation noted with foot and ankle range of motion bilateral. Muscular strength 5/5 in all groups tested bilateral.  Gait: Unassisted, Nonantalgic.   No images are attached to the encounter.  Assessment:   1. Tinea pedis of both feet   2. Onychomycosis   3. Callus of foot      Plan:  Patient was evaluated and treated and all questions answered.  #Discussed the etiology and treatment options for tinea pedis.  Discussed topical and oral treatment.  Recommended topical treatment with  2% ketoconazole cream.  This was sent to the patient's pharmacy.  Also discussed appropriate foot hygiene, use of antifungal spray such as Tinactin in shoes, as well as cleaning her foot surfaces such as showers and bathroom floors with bleach.  -Onychomycosis -Educated on etiology of nail fungus. -eRx for oral terbinafine #30. Educated on risks and benefits of the medication.  # Callus of foot bilaterally in the right midfoot arch and the left plantar first MPJ All symptomatic hyperkeratoses x2 were safely debrided with a sterile #15 blade to patient's level of comfort without incident. We discussed preventative and palliative care of these lesions including supportive and accommodative shoegear, padding, prefabricated and custom molded accommodative orthoses, use of a pumice stone and lotions/creams daily.    Return in about 4 weeks (around 10/25/2022) for f/u tinea pedis/onychomycosis.          Corinna Gab, DPM [facility]

## 2022-10-16 ENCOUNTER — Other Ambulatory Visit: Payer: Self-pay | Admitting: Podiatry

## 2022-10-17 ENCOUNTER — Other Ambulatory Visit: Payer: Self-pay | Admitting: Podiatry

## 2022-11-29 ENCOUNTER — Other Ambulatory Visit: Payer: Self-pay | Admitting: Internal Medicine

## 2022-11-29 DIAGNOSIS — Z1231 Encounter for screening mammogram for malignant neoplasm of breast: Secondary | ICD-10-CM

## 2023-01-10 ENCOUNTER — Other Ambulatory Visit: Payer: Self-pay | Admitting: Physician Assistant

## 2023-01-10 ENCOUNTER — Ambulatory Visit
Admission: RE | Admit: 2023-01-10 | Discharge: 2023-01-10 | Disposition: A | Discharge status: Home / Self Care | Payer: No Typology Code available for payment source | Source: Ambulatory Visit | Attending: Physician Assistant | Admitting: Physician Assistant

## 2023-01-10 DIAGNOSIS — M25562 Pain in left knee: Secondary | ICD-10-CM

## 2023-02-18 ENCOUNTER — Ambulatory Visit
Admission: RE | Admit: 2023-02-18 | Discharge: 2023-02-18 | Disposition: A | Discharge status: Home / Self Care | Payer: No Typology Code available for payment source | Source: Ambulatory Visit | Attending: Internal Medicine | Admitting: Internal Medicine

## 2023-02-18 DIAGNOSIS — Z1231 Encounter for screening mammogram for malignant neoplasm of breast: Secondary | ICD-10-CM

## 2023-04-07 ENCOUNTER — Other Ambulatory Visit: Payer: Self-pay | Admitting: Podiatry

## 2023-04-08 ENCOUNTER — Ambulatory Visit: Payer: No Typology Code available for payment source | Admitting: Podiatry

## 2023-04-11 ENCOUNTER — Ambulatory Visit (INDEPENDENT_AMBULATORY_CARE_PROVIDER_SITE_OTHER): Payer: No Typology Code available for payment source | Admitting: Podiatry

## 2023-04-11 ENCOUNTER — Encounter: Payer: Self-pay | Admitting: Podiatry

## 2023-04-11 DIAGNOSIS — L853 Xerosis cutis: Secondary | ICD-10-CM | POA: Diagnosis not present

## 2023-04-11 MED ORDER — AMMONIUM LACTATE 12 % EX LOTN: 1.0000 | TOPICAL_LOTION | CUTANEOUS | 0 refills | Status: AC | PRN

## 2023-04-11 NOTE — Progress Notes (Signed)
Subjective:  Patient ID: Kimberly Gates, female    DOB: 07-Mar-1956,  MRN: [id number]  Chief Complaint  Patient presents with   Nail Problem    RM#7 Follow up on bottom of foot fungus that keeps returning    67 y.o. female presents with the above complaint.  Patient presents with complaint bilateral severe dryness.  Patient states that is on the bottom of the foot that keeps returning.  She was treated in the past by Dr. Annamary Rummage.  She states a lot of dryness she has not tried anything for it she wanted to discuss treatment options for this.   Review of Systems: Negative except as noted in the HPI. Denies N/V/F/Ch.  Past Medical History:  Diagnosis Date   Anemia    "long time ago"   Arthritis    Hypertension    Shortness of breath dyspnea    on long walks   Thyroid disease    Urinary urgency     Current Outpatient Medications:    ammonium lactate (AMLACTIN DAILY) 12 % lotion, Apply 1 Application topically as needed., Disp: 400 g, Rfl: 0   ammonium lactate (AMLACTIN DAILY) 12 % lotion, Apply 1 Application topically as needed., Disp: 400 g, Rfl: 0   benazepril (LOTENSIN) 10 MG tablet, Take 10 mg by mouth daily., Disp: , Rfl:    docusate sodium (COLACE) 100 MG capsule, Take 1 capsule (100 mg total) by mouth 2 (two) times daily., Disp: 10 capsule, Rfl: 0   ketoconazole (NIZORAL) 2 % cream, APPLY ONE APPLICATION TOPICALLY ONCE A DAY, Disp: 30 g, Rfl: 0   levothyroxine (SYNTHROID, LEVOTHROID) 75 MCG tablet, Take 75 mcg by mouth daily before breakfast., Disp: , Rfl:    methocarbamol (ROBAXIN) 500 MG tablet, Take 1 tablet (500 mg total) by mouth 4 (four) times daily., Disp: 30 tablet, Rfl: 0   Multiple Vitamin (MULTIVITAMIN) capsule, Take 1 capsule by mouth daily., Disp: , Rfl:    naproxen sodium (ALEVE) 220 MG tablet, Take 220 mg by mouth 2 (two) times daily as needed (for pain/sinus pain from allergies). , Disp: , Rfl:    oxyCODONE-acetaminophen (PERCOCET/ROXICET) 5-325 MG  tablet, Take 1 tablet by mouth every 6 (six) hours as needed for severe pain., Disp: 20 tablet, Rfl: 0   terbinafine (LAMISIL) 250 MG tablet, TAKE ONE TABLET BY MOUTH ONCE A DAY, Disp: 30 tablet, Rfl: 0   vitamin C (ASCORBIC ACID) 500 MG tablet, Take 1 tablet (500 mg total) by mouth daily., Disp: 50 tablet, Rfl: 0  Social History   Tobacco Use  Smoking Status Some Days   Current packs/day: 0.00   Types: Cigarettes   Last attempt to quit: 2012   Years since quitting: 12.8  Smokeless Tobacco Never    No Known Allergies Objective:  There were no vitals filed for this visit. There is no height or weight on file to calculate BMI. Constitutional Well developed. Well nourished.  Vascular Dorsalis pedis pulses palpable bilaterally. Posterior tibial pulses palpable bilaterally. Capillary refill normal to all digits.  No cyanosis or clubbing noted. Pedal hair growth normal.  Neurologic Normal speech. Oriented to person, place, and time. Epicritic sensation to light touch grossly present bilaterally.  Dermatologic Bilateral xerosis skin with skin epidermolysis.  Orthopedic: Normal joint ROM without pain or crepitus bilaterally. No visible deformities. No bony tenderness.   Radiographs: None Assessment:   1. Xerosis of skin    Plan:  Patient was evaluated and treated and all questions answered.  Bilateral  dry skin/xerosis -I explained to the patient the etiology of xerosis and various treatment options were extensively discussed.  I explained to the patient the importance of maintaining moisturization of the skin with application of over-the-counter lotion such as Eucerin or Luciderm.  Given the patient has failed over-the-counter options she will benefit from ammonium lactate ammonium lactate was sent to the pharmacy of asked her to apply twice a day she states understanding.   No follow-ups on file.

## 2023-04-16 ENCOUNTER — Encounter: Payer: Self-pay | Admitting: Podiatry

## 2023-05-02 ENCOUNTER — Encounter: Payer: Self-pay | Admitting: Podiatry

## 2023-05-02 ENCOUNTER — Ambulatory Visit (INDEPENDENT_AMBULATORY_CARE_PROVIDER_SITE_OTHER): Payer: No Typology Code available for payment source | Admitting: Podiatry

## 2023-05-02 DIAGNOSIS — M722 Plantar fascial fibromatosis: Secondary | ICD-10-CM

## 2023-05-02 DIAGNOSIS — M62469 Contracture of muscle, unspecified lower leg: Secondary | ICD-10-CM | POA: Diagnosis not present

## 2023-05-02 NOTE — Progress Notes (Signed)
Subjective:  Patient ID: Kimberly Gates, female    DOB: [birth year],  MRN: [id number]  Chief Complaint  Patient presents with   Routine Post Op    PATIENT STATES HER HUSBAND JUST PASSED AWAY , HER LF AND RF  HAS HEEL PAIN , PATIENT WILL LIKE TO KNOW MORE ABOUT THE SHOT FOR PLANTAR FASCIITIS TO TAKE AWAY PAIN AND THE SIDE AFFECTS OF IT .    67 y.o. female presents with the above complaint.  Patient presents with new complaint of bilateral plantar fasciitis/heel pain that has been going for quite some time hurts with ambulation worse with pressure.  She states her husband just passed away and now has heel pain.  She wanted know if she can get shots.  She denies any other acute complaints pain scale 7 out of 10 dull aching nature with ambulation worse with pressure   Review of Systems: Negative except as noted in the HPI. Denies N/V/F/Ch.  Past Medical History:  Diagnosis Date   Anemia    "long time ago"   Arthritis    Hypertension    Shortness of breath dyspnea    on long walks   Thyroid disease    Urinary urgency     Current Outpatient Medications:    ammonium lactate (AMLACTIN DAILY) 12 % lotion, Apply 1 Application topically as needed., Disp: 400 g, Rfl: 0   ammonium lactate (AMLACTIN DAILY) 12 % lotion, Apply 1 Application topically as needed., Disp: 400 g, Rfl: 0   benazepril (LOTENSIN) 10 MG tablet, Take 10 mg by mouth daily., Disp: , Rfl:    docusate sodium (COLACE) 100 MG capsule, Take 1 capsule (100 mg total) by mouth 2 (two) times daily., Disp: 10 capsule, Rfl: 0   ketoconazole (NIZORAL) 2 % cream, APPLY ONE APPLICATION TOPICALLY ONCE A DAY, Disp: 30 g, Rfl: 0   levothyroxine (SYNTHROID, LEVOTHROID) 75 MCG tablet, Take 75 mcg by mouth daily before breakfast., Disp: , Rfl:    methocarbamol (ROBAXIN) 500 MG tablet, Take 1 tablet (500 mg total) by mouth 4 (four) times daily., Disp: 30 tablet, Rfl: 0   Multiple Vitamin (MULTIVITAMIN) capsule, Take 1 capsule by mouth  daily., Disp: , Rfl:    naproxen sodium (ALEVE) 220 MG tablet, Take 220 mg by mouth 2 (two) times daily as needed (for pain/sinus pain from allergies). , Disp: , Rfl:    oxyCODONE-acetaminophen (PERCOCET/ROXICET) 5-325 MG tablet, Take 1 tablet by mouth every 6 (six) hours as needed for severe pain., Disp: 20 tablet, Rfl: 0   terbinafine (LAMISIL) 250 MG tablet, TAKE ONE TABLET BY MOUTH ONCE A DAY, Disp: 30 tablet, Rfl: 0   vitamin C (ASCORBIC ACID) 500 MG tablet, Take 1 tablet (500 mg total) by mouth daily., Disp: 50 tablet, Rfl: 0  Social History   Tobacco Use  Smoking Status Some Days   Current packs/day: 0.00   Types: Cigarettes   Last attempt to quit: 2012   Years since quitting: 12.9  Smokeless Tobacco Never    No Known Allergies Objective:  There were no vitals filed for this visit. There is no height or weight on file to calculate BMI. Constitutional Well developed. Well nourished.  Vascular Dorsalis pedis pulses palpable bilaterally. Posterior tibial pulses palpable bilaterally. Capillary refill normal to all digits.  No cyanosis or clubbing noted. Pedal hair growth normal.  Neurologic Normal speech. Oriented to person, place, and time. Epicritic sensation to light touch grossly present bilaterally.  Dermatologic Nails well groomed and normal  in appearance. No open wounds. No skin lesions.  Orthopedic: Normal joint ROM without pain or crepitus bilaterally. No visible deformities. Tender to palpation at the calcaneal tuber bilaterally. No pain with calcaneal squeeze bilaterally. Ankle ROM diminished range of motion bilaterally. Silfverskiold Test: positive bilaterally.   Radiographs: None Assessment:   1. Plantar fasciitis of right foot   2. Plantar fasciitis of left foot   3. Gastrocnemius equinus, unspecified laterality    Plan:  Patient was evaluated and treated and all questions answered.  Plantar Fasciitis, bilaterally with underlying gastrocnemius  equinus - XR reviewed as above.  - Educated on icing and stretching. Instructions given.  - Injection delivered to the plantar fascia as below. - DME: Plantar fascial brace dispensed to support the medial longitudinal arch of the foot and offload pressure from the heel and prevent arch collapse during weightbearing - Pharmacologic management: None  Procedure: Injection Tendon/Ligament Location: Bilateral plantar fascia at the glabrous junction; medial approach. Skin Prep: alcohol Injectate: 0.5 cc 0.5% marcaine plain, 0.5 cc of 1% Lidocaine, 0.5 cc kenalog 10. Disposition: Patient tolerated procedure well. Injection site dressed with a band-aid.  No follow-ups on file.

## 2023-05-09 ENCOUNTER — Telehealth: Payer: Self-pay | Admitting: Podiatry

## 2023-05-09 NOTE — Telephone Encounter (Signed)
Patient came to office 05/02/2023 and dropped off FMLA docs to be completed -- said she was going out of town due to the death of spouse.  Tried to call her today for details - she said she wanted to extend her LOA.  Called her today @ [phone] (listed as temp ph#) but VM was full - unable to LM .Marland Kitchen...     J. Abbott -- 05/09/2023

## 2023-05-20 ENCOUNTER — Telehealth: Payer: Self-pay | Admitting: Podiatry

## 2023-05-20 NOTE — Telephone Encounter (Signed)
Completed updated FMLA documents with new RTW date of 06/19/2023.  Patient requested both sets of FMLA docs (04/11/23 & 05/20/23) be e-mailed to her [email or web address]> ....   And faxed to her employer (Fax# [phone], Attn:  FMLA) .......     J. Abbott -- 05/20/2023

## 2023-05-20 NOTE — Telephone Encounter (Signed)
Received call from patient - she was asking about her FMLA documents were ready ...   Reminded her that she picked up [facility] docs end of October.  She was asking for an extension of her RTW to end of year and supplied new documents.  Reached out to Mr. Allena Katz for confirmation and he agreed to RTW date as 06/19/2023.      J. Abbott -- 05/20/2023

## 2023-06-05 ENCOUNTER — Ambulatory Visit: Payer: No Typology Code available for payment source | Admitting: Podiatry

## 2023-06-12 ENCOUNTER — Telehealth: Payer: Self-pay | Admitting: Podiatry

## 2023-06-12 NOTE — Telephone Encounter (Signed)
Completed additional FMLA documents -- needed specifications of 04/11/2023 & 05/02/2023 office notes ....   Faxed FMLA docs and office notes from 10/25 & 11/15 to [phone]  .Marland Kitchen...    J. Abbott -- 06/12/2023

## 2023-10-13 ENCOUNTER — Other Ambulatory Visit: Payer: Self-pay | Admitting: Podiatry

## 2024-03-31 ENCOUNTER — Other Ambulatory Visit: Payer: Self-pay | Admitting: Internal Medicine

## 2024-03-31 DIAGNOSIS — Z1231 Encounter for screening mammogram for malignant neoplasm of breast: Secondary | ICD-10-CM
